# Patient Record
Sex: Male | Born: 1970 | Race: White | Hispanic: No
Health system: Southern US, Community
[De-identification: ages and names within clinical notes are randomized; demographics above are authoritative.]

## PROBLEM LIST (undated history)

## (undated) DIAGNOSIS — J9621 Acute and chronic respiratory failure with hypoxia: Secondary | ICD-10-CM

## (undated) DIAGNOSIS — A419 Sepsis, unspecified organism: Secondary | ICD-10-CM

## (undated) DIAGNOSIS — J181 Lobar pneumonia, unspecified organism: Secondary | ICD-10-CM

## (undated) DIAGNOSIS — R6520 Severe sepsis without septic shock: Secondary | ICD-10-CM

## (undated) DIAGNOSIS — G40909 Epilepsy, unspecified, not intractable, without status epilepticus: Secondary | ICD-10-CM

## (undated) DIAGNOSIS — B20 Human immunodeficiency virus [HIV] disease: Secondary | ICD-10-CM

---

## 2019-06-21 ENCOUNTER — Other Ambulatory Visit (HOSPITAL_COMMUNITY): Payer: Medicare Other

## 2019-06-21 ENCOUNTER — Inpatient Hospital Stay
Admission: AD | Admit: 2019-06-21 | Discharge: 2019-08-06 | Disposition: E | Payer: Medicare Other | Source: Other Acute Inpatient Hospital | Attending: Internal Medicine | Admitting: Internal Medicine

## 2019-06-21 DIAGNOSIS — R6520 Severe sepsis without septic shock: Secondary | ICD-10-CM | POA: Diagnosis present

## 2019-06-21 DIAGNOSIS — K567 Ileus, unspecified: Secondary | ICD-10-CM

## 2019-06-21 DIAGNOSIS — Z9289 Personal history of other medical treatment: Secondary | ICD-10-CM

## 2019-06-21 DIAGNOSIS — R1915 Other abnormal bowel sounds: Secondary | ICD-10-CM

## 2019-06-21 DIAGNOSIS — Z0189 Encounter for other specified special examinations: Secondary | ICD-10-CM

## 2019-06-21 DIAGNOSIS — J9621 Acute and chronic respiratory failure with hypoxia: Secondary | ICD-10-CM | POA: Diagnosis present

## 2019-06-21 DIAGNOSIS — J181 Lobar pneumonia, unspecified organism: Secondary | ICD-10-CM | POA: Diagnosis present

## 2019-06-21 DIAGNOSIS — A419 Sepsis, unspecified organism: Secondary | ICD-10-CM | POA: Diagnosis present

## 2019-06-21 DIAGNOSIS — J9690 Respiratory failure, unspecified, unspecified whether with hypoxia or hypercapnia: Secondary | ICD-10-CM

## 2019-06-21 DIAGNOSIS — E8770 Fluid overload, unspecified: Secondary | ICD-10-CM

## 2019-06-21 DIAGNOSIS — Z9889 Other specified postprocedural states: Secondary | ICD-10-CM

## 2019-06-21 DIAGNOSIS — Z931 Gastrostomy status: Secondary | ICD-10-CM

## 2019-06-21 DIAGNOSIS — J90 Pleural effusion, not elsewhere classified: Secondary | ICD-10-CM

## 2019-06-21 DIAGNOSIS — Z4659 Encounter for fitting and adjustment of other gastrointestinal appliance and device: Secondary | ICD-10-CM

## 2019-06-21 DIAGNOSIS — B20 Human immunodeficiency virus [HIV] disease: Secondary | ICD-10-CM | POA: Diagnosis present

## 2019-06-21 DIAGNOSIS — G40909 Epilepsy, unspecified, not intractable, without status epilepticus: Secondary | ICD-10-CM

## 2019-06-21 HISTORY — DX: Acute and chronic respiratory failure with hypoxia: J96.21

## 2019-06-21 HISTORY — DX: Sepsis, unspecified organism: A41.9

## 2019-06-21 HISTORY — DX: Human immunodeficiency virus (HIV) disease: B20

## 2019-06-21 HISTORY — DX: Lobar pneumonia, unspecified organism: J18.1

## 2019-06-21 HISTORY — DX: Epilepsy, unspecified, not intractable, without status epilepticus: G40.909

## 2019-06-21 HISTORY — DX: Sepsis, unspecified organism: R65.20

## 2019-06-21 LAB — BLOOD GAS, ARTERIAL
Acid-Base Excess: 12.9 mmol/L — ABNORMAL HIGH (ref 0.0–2.0)
Bicarbonate: 37.3 mmol/L — ABNORMAL HIGH (ref 20.0–28.0)
FIO2: 35
O2 Saturation: 96.5 %
Patient temperature: 37
pCO2 arterial: 50.3 mmHg — ABNORMAL HIGH (ref 32.0–48.0)
pH, Arterial: 7.483 — ABNORMAL HIGH (ref 7.350–7.450)
pO2, Arterial: 79.7 mmHg — ABNORMAL LOW (ref 83.0–108.0)

## 2019-06-21 MED ORDER — DULOXETINE HCL 30 MG PO CPEP
30.00 | ORAL_CAPSULE | ORAL | Status: DC
Start: 2019-06-22 — End: 2019-06-21

## 2019-06-21 MED ORDER — MULTI-VITAMIN PO TABS
1.00 | ORAL_TABLET | ORAL | Status: DC
Start: 2019-06-22 — End: 2019-06-21

## 2019-06-21 MED ORDER — L-LYSINE EX OINT
TOPICAL_OINTMENT | CUTANEOUS | Status: DC
Start: ? — End: 2019-06-21

## 2019-06-21 MED ORDER — GENERIC EXTERNAL MEDICATION
Status: DC
Start: ? — End: 2019-06-21

## 2019-06-21 MED ORDER — METOPROLOL TARTRATE 25 MG PO TABS
25.00 | ORAL_TABLET | ORAL | Status: DC
Start: 2019-06-21 — End: 2019-06-21

## 2019-06-21 MED ORDER — ELVITEG-COBIC-EMTRICIT-TENOFAF 150-150-200-10 MG PO TABS
1.00 | ORAL_TABLET | ORAL | Status: DC
Start: 2019-06-22 — End: 2019-06-21

## 2019-06-21 MED ORDER — ONDANSETRON HCL 4 MG/2ML IJ SOLN
4.00 | INTRAMUSCULAR | Status: DC
Start: ? — End: 2019-06-21

## 2019-06-21 MED ORDER — INSULIN NPH (HUMAN) (ISOPHANE) 100 UNIT/ML ~~LOC~~ SUSP
25.00 | SUBCUTANEOUS | Status: DC
Start: 2019-06-21 — End: 2019-06-21

## 2019-06-21 MED ORDER — FOLIC ACID 1 MG PO TABS
1.00 | ORAL_TABLET | ORAL | Status: DC
Start: 2019-06-22 — End: 2019-06-21

## 2019-06-21 MED ORDER — QUETIAPINE FUMARATE 25 MG PO TABS
25.00 | ORAL_TABLET | ORAL | Status: DC
Start: 2019-06-21 — End: 2019-06-21

## 2019-06-21 MED ORDER — PREDNISONE 10 MG PO TABS
10.00 | ORAL_TABLET | ORAL | Status: DC
Start: 2019-06-21 — End: 2019-06-21

## 2019-06-21 MED ORDER — PREGABALIN 100 MG PO CAPS
100.00 | ORAL_CAPSULE | ORAL | Status: DC
Start: 2019-06-21 — End: 2019-06-21

## 2019-06-21 MED ORDER — DEXTROSE 50 % IV SOLN
25.00 | INTRAVENOUS | Status: DC
Start: ? — End: 2019-06-21

## 2019-06-21 MED ORDER — PNEUMOCOCCAL VAC POLYVALENT 25 MCG/0.5ML IJ INJ
0.50 | INJECTION | INTRAMUSCULAR | Status: DC
Start: ? — End: 2019-06-21

## 2019-06-21 MED ORDER — LEVETIRACETAM IN NACL 500 MG/100ML IV SOLN
500.00 | INTRAVENOUS | Status: DC
Start: 2019-06-22 — End: 2019-06-21

## 2019-06-21 MED ORDER — SODIUM CHLORIDE 0.9 % IV SOLN
INTRAVENOUS | Status: DC
Start: ? — End: 2019-06-21

## 2019-06-21 MED ORDER — FENTANYL CITRATE (PF) 50 MCG/ML IJ SOLN
100.00 | INTRAMUSCULAR | Status: DC
Start: ? — End: 2019-06-21

## 2019-06-21 MED ORDER — MIRTAZAPINE 15 MG PO TABS
15.00 | ORAL_TABLET | ORAL | Status: DC
Start: 2019-06-21 — End: 2019-06-21

## 2019-06-21 MED ORDER — CARBOXYMETHYLCELLULOSE SODIUM 0.5 % OP SOLN
2.00 | OPHTHALMIC | Status: DC
Start: 2019-06-21 — End: 2019-06-21

## 2019-06-21 MED ORDER — NALOXONE HCL 0.4 MG/ML IJ SOLN
0.04 | INTRAMUSCULAR | Status: DC
Start: ? — End: 2019-06-21

## 2019-06-21 MED ORDER — GENERIC EXTERNAL MEDICATION
5.00 | Status: DC
Start: ? — End: 2019-06-21

## 2019-06-21 MED ORDER — LACTULOSE 10 GM/15ML PO SOLN
45.00 | ORAL | Status: DC
Start: 2019-06-21 — End: 2019-06-21

## 2019-06-21 MED ORDER — DEXTROSE 5 % IV SOLN
INTRAVENOUS | Status: DC
Start: ? — End: 2019-06-21

## 2019-06-21 MED ORDER — PANTOPRAZOLE SODIUM 40 MG IV SOLR
40.00 | INTRAVENOUS | Status: DC
Start: 2019-06-22 — End: 2019-06-21

## 2019-06-21 MED ORDER — THIAMINE HCL 100 MG PO TABS
100.00 | ORAL_TABLET | ORAL | Status: DC
Start: 2019-06-22 — End: 2019-06-21

## 2019-06-21 MED ORDER — ALBUTEROL SULFATE (5 MG/ML) 0.5% IN NEBU
2.50 | INHALATION_SOLUTION | RESPIRATORY_TRACT | Status: DC
Start: ? — End: 2019-06-21

## 2019-06-21 MED ORDER — EPINEPHRINE 1 MG/10ML IJ SOSY
1.00 | PREFILLED_SYRINGE | INTRAMUSCULAR | Status: DC
Start: ? — End: 2019-06-21

## 2019-06-21 MED ORDER — MAGNESIUM SULFATE 4 GM/100ML IV SOLN
4.00 | INTRAVENOUS | Status: DC
Start: ? — End: 2019-06-21

## 2019-06-21 MED ORDER — GENERIC EXTERNAL MEDICATION
1.00 | Status: DC
Start: ? — End: 2019-06-21

## 2019-06-21 MED ORDER — GLUCOSE-VITAMIN C 4-6 GM-MG PO CHEW
12.00 | CHEWABLE_TABLET | ORAL | Status: DC
Start: ? — End: 2019-06-21

## 2019-06-21 MED ORDER — BACLOFEN 10 MG PO TABS
10.00 | ORAL_TABLET | ORAL | Status: DC
Start: ? — End: 2019-06-21

## 2019-06-21 MED ORDER — RIFAXIMIN 550 MG PO TABS
550.00 | ORAL_TABLET | ORAL | Status: DC
Start: 2019-06-21 — End: 2019-06-21

## 2019-06-21 MED ORDER — CLONIDINE HCL 0.1 MG PO TABS
0.10 | ORAL_TABLET | ORAL | Status: DC
Start: 2019-06-21 — End: 2019-06-21

## 2019-06-21 MED ORDER — FLUPHENAZINE HCL 1 MG PO TABS
1.00 | ORAL_TABLET | ORAL | Status: DC
Start: 2019-06-21 — End: 2019-06-21

## 2019-06-21 MED ORDER — ACETAMINOPHEN 325 MG PO TABS
650.00 | ORAL_TABLET | ORAL | Status: DC
Start: ? — End: 2019-06-21

## 2019-06-21 MED ORDER — LACTATED RINGERS IV SOLN
500.00 | INTRAVENOUS | Status: DC
Start: ? — End: 2019-06-21

## 2019-06-21 MED ORDER — IPRATROPIUM-ALBUTEROL 0.5-2.5 (3) MG/3ML IN SOLN
3.00 | RESPIRATORY_TRACT | Status: DC
Start: ? — End: 2019-06-21

## 2019-06-21 MED ORDER — INSULIN ASPART 100 UNIT/ML FLEXPEN
0.00 | PEN_INJECTOR | SUBCUTANEOUS | Status: DC
Start: 2019-06-21 — End: 2019-06-21

## 2019-06-21 MED ORDER — FLUMAZENIL 0.5 MG/5ML IV SOLN
0.20 | INTRAVENOUS | Status: DC
Start: ? — End: 2019-06-21

## 2019-06-21 MED ORDER — RA PROBIOTIC DIGESTIVE CARE PO CAPS
1.00 | ORAL_CAPSULE | ORAL | Status: DC
Start: 2019-06-21 — End: 2019-06-21

## 2019-06-21 MED ORDER — GENERIC EXTERNAL MEDICATION
4000.00 | Status: DC
Start: ? — End: 2019-06-21

## 2019-06-21 MED ORDER — DEXMEDETOMIDINE HCL IN NACL 400 MCG/100ML IV SOLN
0.15 | INTRAVENOUS | Status: DC
Start: ? — End: 2019-06-21

## 2019-06-21 MED ORDER — ATROPINE SULFATE 0.5 MG/5ML IJ SOSY
0.50 | PREFILLED_SYRINGE | INTRAMUSCULAR | Status: DC
Start: ? — End: 2019-06-21

## 2019-06-22 ENCOUNTER — Encounter: Payer: Self-pay | Admitting: Internal Medicine

## 2019-06-22 DIAGNOSIS — G40909 Epilepsy, unspecified, not intractable, without status epilepticus: Secondary | ICD-10-CM

## 2019-06-22 DIAGNOSIS — B20 Human immunodeficiency virus [HIV] disease: Secondary | ICD-10-CM

## 2019-06-22 DIAGNOSIS — R6520 Severe sepsis without septic shock: Secondary | ICD-10-CM

## 2019-06-22 DIAGNOSIS — J9621 Acute and chronic respiratory failure with hypoxia: Secondary | ICD-10-CM | POA: Diagnosis present

## 2019-06-22 DIAGNOSIS — J181 Lobar pneumonia, unspecified organism: Secondary | ICD-10-CM | POA: Diagnosis not present

## 2019-06-22 DIAGNOSIS — A419 Sepsis, unspecified organism: Secondary | ICD-10-CM

## 2019-06-22 LAB — COMPREHENSIVE METABOLIC PANEL
ALT: 52 U/L — ABNORMAL HIGH (ref 0–44)
AST: 79 U/L — ABNORMAL HIGH (ref 15–41)
Albumin: 1.9 g/dL — ABNORMAL LOW (ref 3.5–5.0)
Alkaline Phosphatase: 151 U/L — ABNORMAL HIGH (ref 38–126)
Anion gap: 8 (ref 5–15)
BUN: 17 mg/dL (ref 6–20)
CO2: 34 mmol/L — ABNORMAL HIGH (ref 22–32)
Calcium: 8.5 mg/dL — ABNORMAL LOW (ref 8.9–10.3)
Chloride: 104 mmol/L (ref 98–111)
Creatinine, Ser: 0.51 mg/dL — ABNORMAL LOW (ref 0.61–1.24)
GFR calc Af Amer: >60 mL/min (ref >60)
GFR calc non Af Amer: >60 mL/min (ref >60)
Glucose, Bld: 103 mg/dL — ABNORMAL HIGH (ref 70–99)
Potassium: 3.7 mmol/L (ref 3.5–5.1)
Sodium: 146 mmol/L — ABNORMAL HIGH (ref 135–145)
Total Bilirubin: 8 mg/dL — ABNORMAL HIGH (ref 0.3–1.2)
Total Protein: 5.4 g/dL — ABNORMAL LOW (ref 6.5–8.1)

## 2019-06-22 LAB — CBC
HCT: 39.4 % (ref 39.0–52.0)
Hemoglobin: 12.6 g/dL — ABNORMAL LOW (ref 13.0–17.0)
MCH: 39.7 pg — ABNORMAL HIGH (ref 26.0–34.0)
MCHC: 32 g/dL (ref 30.0–36.0)
MCV: 124.3 fL — ABNORMAL HIGH (ref 80.0–100.0)
Platelets: 46 10*3/uL — ABNORMAL LOW (ref 150–400)
RBC: 3.17 MIL/uL — ABNORMAL LOW (ref 4.22–5.81)
RDW: 15.3 % (ref 11.5–15.5)
WBC: 9.5 10*3/uL (ref 4.0–10.5)
nRBC: 0 % (ref 0.0–0.2)

## 2019-06-22 NOTE — Consult Note (Signed)
Pulmonary Critical Care Medicine [place]  PULMONARY SERVICE  Date of Service: 06/22/2019  PULMONARY CRITICAL CARE CONSULT   Anthony Rodriguez  NID:[id number]  DOB: May 31, 1971   DOA: 06/30/2019  Referring Physician: Merton Border, MD  HPI: Anthony Rodriguez is a 48 y.o. male seen for follow up of Acute on Chronic Respiratory Failure.  Patient has multiple medical problems including history of HIV on Hart therapy hepatitis C cirrhosis of liver seizure disorder bipolar disorder presented to the hospital because of a diagnosis of pneumonia.  Patient at that time was seen started on antibiotics had been complaining about generalized weakness aches and pains.  Admitted to the hospital had increased work of breathing noted increased oxygen requirements and patient eventually ended up having to be intubated after about 4 days.  Patient now is orally intubated on the ventilator and is nonverbal and has endotracheal tube in place.  Hospital course was that patient did receive antibiotics and was transferred to our facility for further management and weaning.  Review of Systems:  ROS performed and is unremarkable other than noted above.  Past Medical History:  Diagnosis Date  . Anxiety  . Arthritis  . Back pain  . Bilateral leg pain  . Bipolar 1 disorder ([place])  . Bipolar 1 disorder ([place])  . Bipolar disorder ([place])  . Blood disorder  low platelets  . Calculus of kidney  . Cirrhosis ([place])  HEPATITIS C  . Cirrhosis, non-alcoholic ([place])  . 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 Depression  PTSD  . Diabetes mellitus ([place])  . 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 GERD (gastroesophageal reflux disease)  . Headache  . Herpes  . Human immunodeficiency virus (HIV) disease ([place])  . Left shoulder pain  . Low back pain  . Migraines  . MS (multiple sclerosis) ([place])  . Neck pain  . Portal vein thrombosis  . Psychosis ([place])  . PTSD  (post-traumatic stress disorder)  . Seizure disorder ([place])  . Splenomegaly  . Varices, esophageal (HCC)  GRADE 4 ESOPHAGEAL VARICES  . Vitamin D deficiency    Past Surgical History Past Surgical History:  Procedure Laterality Date  . HX APPENDECTOMY  . HX EAR SURGERY  stapes transplant  . HX KNEE ARTHROSCOPY Left  . HX KNEE REPLACMENT  . HX LITHOTRIPSY  . HX SURGICAL OTHER  bands in throat and stomach, due to cirrhosis for esophageal varicies  . HX TIPS PROCEDURE  . HX TYMPANOSTOMY  . PR COLONOSCOPY FLX DX W/COLLJ SPEC WHEN PFRMD N/A 01/21/2015  COLONOSCOPY FLX DX W/COLLJ SPEC WHEN PFRMD performed by Jana Half, MD at [place]  . PR EGD TRANSORAL BIOPSY SINGLE/MULTIPLE Left 12/03/2014  EGD TRANSORAL BIOPSY SINGLE/MULTIPLE performed by Jana Half, MD at [place]  . PR ESOPHAGOGASTRODUODENOSCOPY TRANSORAL DIAGNOSTIC N/A 12/27/2014  ESOPHAGOGASTRODUODENOSCOPY TRANSORAL DIAGNOSTIC performed by Jana Half, MD at [place]  . PR ESOPHAGOGASTRODUODENOSCOPY TRANSORAL DIAGNOSTIC N/A 04/07/2017  ESOPHAGOGASTRODUODENOSCOPY TRANSORAL DIAGNOSTIC performed by Donald Pore, MD at [place] reports that he has been smoking e-cigarette/mist inhalation device. He has a 32.00 pack-year smoking history. He has never used smokeless tobacco. He reports that he does not drink alcohol or use drugs.  Family History family history includes Diabetes in his father and paternal grandmother; Heart Disease in his father; Hypertension in his father; Liver Disease in his father.   Allergies Allergies  Allergen Reactions  . Lactulose Nausea and Vomiting  .  Paroxetine Other (See Comments)  Flu like symptoms    Medications: Reviewed on Rounds  Physical Exam:  Vitals: Temperature 97.6 pulse 63 respiratory 12 blood pressure 129/70 saturations 97%  Ventilator Settings mode of ventilation is pressure support FiO2 40% tidal line 800 PEEP 5 pressure support 12 orally  intubated  . General: Comfortable at this time . Eyes: Grossly normal lids, irises & conjunctiva . ENT: grossly tongue is normal . Neck: no obvious mass . Cardiovascular: S1-S2 normal no gallop or rub . Respiratory: No rhonchi no rales are noted at this time . Abdomen: Soft and nontender . Skin: no rash seen on limited exam . Musculoskeletal: not rigid . Psychiatric:unable to assess . Neurologic: no seizure no involuntary movements         Labs on Admission:  Basic Metabolic Panel: Recent Labs  Lab 06/22/19 0514  NA 146*  K 3.7  CL 104  CO2 34*  GLUCOSE 103*  BUN 17  CREATININE 0.51*  CALCIUM 8.5*    Recent Labs  Lab 10-28-18 1858  PHART 7.483*  PCO2ART 50.3*  PO2ART 79.7*  HCO3 37.3*  O2SAT 96.5    Liver Function Tests: Recent Labs  Lab 06/22/19 0514  AST 79*  ALT 52*  ALKPHOS 151*  BILITOT 8.0*  PROT 5.4*  ALBUMIN 1.9*   No results for input(s): LIPASE, AMYLASE in the last 168 hours. No results for input(s): AMMONIA in the last 168 hours.  CBC: Recent Labs  Lab 06/22/19 0514  WBC 9.5  HGB 12.6*  HCT 39.4  MCV 124.3*  PLT 46*    Cardiac Enzymes: No results for input(s): CKTOTAL, CKMB, CKMBINDEX, TROPONINI in the last 168 hours.  BNP (last 3 results) No results for input(s): BNP in the last 8760 hours.  ProBNP (last 3 results) No results for input(s): PROBNP in the last 8760 hours.   Radiological Exams on Admission: DG Abd 1 View  Result Date: 10-28-2018 CLINICAL DATA:  OG and ETT placement EXAM: ABDOMEN - 1 VIEW COMPARISON:  Concurrent chest radiograph FINDINGS: Central venous catheter tip terminates in the region of the right atrium. Transesophageal tube tip and side port distal to the GE junction. Vascular coil pack noted in the midline upper abdomen. A biliary stent present in the right upper quadrant. Additional surgical clip seen of the right lower quadrant. Telemetry leads overlies the left hemiabdomen. Nonobstructive bowel gas  pattern. Few punctate radiodensities project over the right renal shadow. Streaky basilar opacities in the lungs likely reflect some atelectasis and right effusion. IMPRESSION: 1. Central venous catheter tip terminates in the right atrium. 2. Transesophageal tube tip and side port distal to the GE junction. 3. Few punctate radiodensities project over the right renal shadow. Could reflect nephrolithiasis. 4. Nonobstructive bowel gas pattern. Electronically Signed   By: Kreg ShropshirePrice  DeHay M.D.   On: 10-28-2018 17:34   DG Chest Port 1 View  Result Date: 10-28-2018 CLINICAL DATA:  Line placement EXAM: PORTABLE CHEST 1 VIEW COMPARISON:  None. FINDINGS: Endotracheal tube is approximately 6 cm from the carina. Enteric tube passes into the stomach with tip out of field of view. Right PICC line tip overlies the cavoatrial junction. No focal consolidation. Question hazy right lung increased density. No pneumothorax. No significant pleural effusion. Normal heart size. IMPRESSION: Lines and tubes as above. Possible hazy increased density on the right, which may reflect atelectasis or asymmetric edema. Electronically Signed   By: Guadlupe SpanishPraneil  Patel M.D.   On: 10-28-2018 17:30    Assessment/Plan  Active Problems:   Acute on chronic respiratory failure with hypoxia (HCC)   Lobar pneumonia, unspecified organism (HCC)   Severe sepsis (HCC)   HIV disease (HCC)   Seizure disorder (HCC)   1. Acute on chronic respiratory failure with hypoxia he remains on the ventilator and pressure support mode which appears to be tolerating well right now remains orally intubated we will see if able to tolerate we will give him an extubation trial if he fails then patient will need to be trached.  We will monitor ABGs monitor chest x-rays as necessary 2. Lobar pneumonia unspecified patient had a chest x-ray done most recent x-ray shows some layering densities in the right side with atelectasis versus edema we will continue with supportive care  and has completed antibiotics. 3. Severe sepsis secondary to lobar pneumonia has been treated right now hemodynamics are stable continue with supportive care 4. HIV disease patient is on [place] therapy plan is to continue treatment. 5. Seizure disorder no active seizures noted at this time we will continue to monitor.  I have personally seen and evaluated the patient, evaluated laboratory and imaging results, formulated the assessment and plan and placed orders.  Patient is critically ill in danger of cardiac arrest and death requires ongoing critical management ventilator and has a high risk airway The Patient requires high complexity decision making for assessment and support.  Case was discussed on Rounds with the Respiratory Therapy Staff Time Spent  Yevonne Pax, MD [facility] Pulmonary Critical Care Medicine Sleep Medicine

## 2019-06-23 DIAGNOSIS — J181 Lobar pneumonia, unspecified organism: Secondary | ICD-10-CM | POA: Diagnosis not present

## 2019-06-23 DIAGNOSIS — J9621 Acute and chronic respiratory failure with hypoxia: Secondary | ICD-10-CM | POA: Diagnosis not present

## 2019-06-23 DIAGNOSIS — B20 Human immunodeficiency virus [HIV] disease: Secondary | ICD-10-CM | POA: Diagnosis not present

## 2019-06-23 DIAGNOSIS — G40909 Epilepsy, unspecified, not intractable, without status epilepticus: Secondary | ICD-10-CM | POA: Diagnosis not present

## 2019-06-23 NOTE — Progress Notes (Signed)
Pulmonary Critical Care Medicine [facility] [place]   PULMONARY CRITICAL CARE SERVICE  PROGRESS NOTE  Date of Service: 06/23/2019  DAKWAN PRIDGEN  BHA:[id number]  DOB: 1971-02-26   DOA: 07/02/2019  Referring Physician: Carron Curie, MD  HPI: Anthony Rodriguez is a 48 y.o. male seen for follow up of Acute on Chronic Respiratory Failure.  Patient currently is on pressure support mode has been on FiO2 40% goal is for 12 hours.  Remains orally intubated.  His volumes are actually looking pretty good so may be encouraging that we may actually be able to give him a trial of extubation  Medications: Reviewed on Rounds  Physical Exam:  Vitals: Temperature 97.8 pulse 88 respiratory 18 blood pressure 139/76 saturations 96%  Ventilator Settings mode of ventilation pressure support FiO2 is 40% tidal volume was 680 pressure 12 PEEP 5  . General: Comfortable at this time . Eyes: Grossly normal lids, irises & conjunctiva . ENT: grossly tongue is normal . Neck: no obvious mass . Cardiovascular: S1 S2 normal no gallop . Respiratory: No rhonchi no rales are noted at this time . Abdomen: soft . Skin: no rash seen on limited exam . Musculoskeletal: not rigid . Psychiatric:unable to assess . Neurologic: no seizure no involuntary movements         Lab Data:   Basic Metabolic Panel: Recent Labs  Lab 06/22/19 0514  NA 146*  K 3.7  CL 104  CO2 34*  GLUCOSE 103*  BUN 17  CREATININE 0.51*  CALCIUM 8.5*    ABG: Recent Labs  Lab 06/12/2019 1858  PHART 7.483*  PCO2ART 50.3*  PO2ART 79.7*  HCO3 37.3*  O2SAT 96.5    Liver Function Tests: Recent Labs  Lab 06/22/19 0514  AST 79*  ALT 52*  ALKPHOS 151*  BILITOT 8.0*  PROT 5.4*  ALBUMIN 1.9*   No results for input(s): LIPASE, AMYLASE in the last 168 hours. No results for input(s): AMMONIA in the last 168 hours.  CBC: Recent Labs  Lab 06/22/19 0514  WBC 9.5  HGB 12.6*  HCT 39.4  MCV 124.3*  PLT 46*    Cardiac  Enzymes: No results for input(s): CKTOTAL, CKMB, CKMBINDEX, TROPONINI in the last 168 hours.  BNP (last 3 results) No results for input(s): BNP in the last 8760 hours.  ProBNP (last 3 results) No results for input(s): PROBNP in the last 8760 hours.  Radiological Exams: DG Abd 1 View  Result Date: 06/28/2019 CLINICAL DATA:  OG and ETT placement EXAM: ABDOMEN - 1 VIEW COMPARISON:  Concurrent chest radiograph FINDINGS: Central venous catheter tip terminates in the region of the right atrium. Transesophageal tube tip and side port distal to the GE junction. Vascular coil pack noted in the midline upper abdomen. A biliary stent present in the right upper quadrant. Additional surgical clip seen of the right lower quadrant. Telemetry leads overlies the left hemiabdomen. Nonobstructive bowel gas pattern. Few punctate radiodensities project over the right renal shadow. Streaky basilar opacities in the lungs likely reflect some atelectasis and right effusion. IMPRESSION: 1. Central venous catheter tip terminates in the right atrium. 2. Transesophageal tube tip and side port distal to the GE junction. 3. Few punctate radiodensities project over the right renal shadow. Could reflect nephrolithiasis. 4. Nonobstructive bowel gas pattern. Electronically Signed   By: Kreg Shropshire M.D.   On: 06/11/2019 17:34   DG Chest Port 1 View  Result Date: 06/11/2019 CLINICAL DATA:  Line placement EXAM: PORTABLE CHEST 1 VIEW  COMPARISON:  None. FINDINGS: Endotracheal tube is approximately 6 cm from the carina. Enteric tube passes into the stomach with tip out of field of view. Right PICC line tip overlies the cavoatrial junction. No focal consolidation. Question hazy right lung increased density. No pneumothorax. No significant pleural effusion. Normal heart size. IMPRESSION: Lines and tubes as above. Possible hazy increased density on the right, which may reflect atelectasis or asymmetric edema. Electronically Signed   By:  Macy Mis M.D.   On: 07-12-2019 17:30    Assessment/Plan Active Problems:   Acute on chronic respiratory failure with hypoxia (HCC)   Lobar pneumonia, unspecified organism (HCC)   Severe sepsis (HCC)   HIV disease (HCC)   Seizure disorder ([place])   1. Acute on chronic respiratory failure hypoxia plan is to continue with weaning protocol patient remains orally intubated with a high risk airway 2. Lobar pneumonia treated we will continue to monitor. 3. Severe sepsis hemodynamics are stable 4. HIV disease on Hart therapy 5. Seizure disorder no active seizure noted at this time.   I have personally seen and evaluated the patient, evaluated laboratory and imaging results, formulated the assessment and plan and placed orders.  Patient is critically ill in danger of cardiac arrest and death as a high risk airway time 35 minutes The Patient requires high complexity decision making for assessment and support.  Case was discussed on Rounds with the Respiratory Therapy Staff  Allyne Gee, MD [facility] Pulmonary Critical Care Medicine Sleep Medicine

## 2019-06-24 ENCOUNTER — Other Ambulatory Visit (HOSPITAL_COMMUNITY): Payer: Medicare Other

## 2019-06-24 DIAGNOSIS — J9621 Acute and chronic respiratory failure with hypoxia: Secondary | ICD-10-CM | POA: Diagnosis not present

## 2019-06-24 DIAGNOSIS — J181 Lobar pneumonia, unspecified organism: Secondary | ICD-10-CM | POA: Diagnosis not present

## 2019-06-24 DIAGNOSIS — B20 Human immunodeficiency virus [HIV] disease: Secondary | ICD-10-CM | POA: Diagnosis not present

## 2019-06-24 DIAGNOSIS — G40909 Epilepsy, unspecified, not intractable, without status epilepticus: Secondary | ICD-10-CM | POA: Diagnosis not present

## 2019-06-24 LAB — BASIC METABOLIC PANEL
Anion gap: 6 (ref 5–15)
BUN: 20 mg/dL (ref 6–20)
CO2: 33 mmol/L — ABNORMAL HIGH (ref 22–32)
Calcium: 8.6 mg/dL — ABNORMAL LOW (ref 8.9–10.3)
Chloride: 110 mmol/L (ref 98–111)
Creatinine, Ser: 0.6 mg/dL — ABNORMAL LOW (ref 0.61–1.24)
GFR calc Af Amer: >60 mL/min (ref >60)
GFR calc non Af Amer: >60 mL/min (ref >60)
Glucose, Bld: 159 mg/dL — ABNORMAL HIGH (ref 70–99)
Potassium: 3.7 mmol/L (ref 3.5–5.1)
Sodium: 149 mmol/L — ABNORMAL HIGH (ref 135–145)

## 2019-06-24 LAB — CBC
HCT: 39.7 % (ref 39.0–52.0)
HCT: 37.6 % — ABNORMAL LOW (ref 39.0–52.0)
Hemoglobin: 12.9 g/dL — ABNORMAL LOW (ref 13.0–17.0)
Hemoglobin: 12.2 g/dL — ABNORMAL LOW (ref 13.0–17.0)
MCH: 40.7 pg — ABNORMAL HIGH (ref 26.0–34.0)
MCH: 40.5 pg — ABNORMAL HIGH (ref 26.0–34.0)
MCHC: 32.5 g/dL (ref 30.0–36.0)
MCHC: 32.4 g/dL (ref 30.0–36.0)
MCV: 125.2 fL — ABNORMAL HIGH (ref 80.0–100.0)
MCV: 124.9 fL — ABNORMAL HIGH (ref 80.0–100.0)
Platelets: 51 10*3/uL — ABNORMAL LOW (ref 150–400)
Platelets: 47 10*3/uL — ABNORMAL LOW (ref 150–400)
RBC: 3.17 MIL/uL — ABNORMAL LOW (ref 4.22–5.81)
RBC: 3.01 MIL/uL — ABNORMAL LOW (ref 4.22–5.81)
RDW: 15.6 % — ABNORMAL HIGH (ref 11.5–15.5)
RDW: 15.8 % — ABNORMAL HIGH (ref 11.5–15.5)
WBC: 8.5 10*3/uL (ref 4.0–10.5)
WBC: 7.3 10*3/uL (ref 4.0–10.5)
nRBC: 0 % (ref 0.0–0.2)
nRBC: 0 % (ref 0.0–0.2)

## 2019-06-24 LAB — LACTATE DEHYDROGENASE: LDH: 316 U/L — ABNORMAL HIGH (ref 98–192)

## 2019-06-24 MED ORDER — GENERIC EXTERNAL MEDICATION
Status: DC
Start: ? — End: 2019-06-24

## 2019-06-24 NOTE — Progress Notes (Signed)
Pulmonary Critical Care Medicine [place]   PULMONARY CRITICAL CARE SERVICE  PROGRESS NOTE  Date of Service: 06/24/2019  Anthony Rodriguez  KKX:[id number]  DOB: 03-21-1971   DOA: 07/01/2019  Referring Physician: Merton Border, MD  HPI: Anthony Rodriguez is a 48 y.o. male seen for follow up of Acute on Chronic Respiratory Failure.  Patient currently is on pressure support remains orally intubated.  Has been having very low respiratory rate likely from sedation  Medications: Reviewed on Rounds  Physical Exam:  Vitals: Temperature 97.1 pulse 77 respiratory 16 blood pressure 131/81 saturations 98%  Ventilator Settings mode of ventilation pressure support FiO2 is 40% pressure poor 12 PEEP five tidal volume is about 800  . General: Comfortable at this time . Eyes: Grossly normal lids, irises & conjunctiva . ENT: grossly tongue is normal . Neck: no obvious mass . Cardiovascular: S1 S2 normal no gallop . Respiratory: No rhonchi no rales are noted at this time . Abdomen: soft . Skin: no rash seen on limited exam . Musculoskeletal: not rigid . Psychiatric:unable to assess . Neurologic: no seizure no involuntary movements         Lab Data:   Basic Metabolic Panel: Recent Labs  Lab 06/22/19 0514 06/24/19 0539  NA 146* 149*  K 3.7 3.7  CL 104 110  CO2 34* 33*  GLUCOSE 103* 159*  BUN 17 20  CREATININE 0.51* 0.60*  CALCIUM 8.5* 8.6*    ABG: Recent Labs  Lab 07/05/2019 1858  PHART 7.483*  PCO2ART 50.3*  PO2ART 79.7*  HCO3 37.3*  O2SAT 96.5    Liver Function Tests: Recent Labs  Lab 06/22/19 0514  AST 79*  ALT 52*  ALKPHOS 151*  BILITOT 8.0*  PROT 5.4*  ALBUMIN 1.9*   No results for input(s): LIPASE, AMYLASE in the last 168 hours. No results for input(s): AMMONIA in the last 168 hours.  CBC: Recent Labs  Lab 06/22/19 0514 06/24/19 0539  WBC 9.5 8.5  HGB 12.6* 12.9*  HCT 39.4 39.7  MCV 124.3* 125.2*  PLT 46* 51*    Cardiac  Enzymes: No results for input(s): CKTOTAL, CKMB, CKMBINDEX, TROPONINI in the last 168 hours.  BNP (last 3 results) No results for input(s): BNP in the last 8760 hours.  ProBNP (last 3 results) No results for input(s): PROBNP in the last 8760 hours.  Radiological Exams: DG CHEST PORT 1 VIEW  Result Date: 06/24/2019 CLINICAL DATA:  Respiratory failure. EXAM: PORTABLE CHEST 1 VIEW COMPARISON:  Chest x-ray dated June 21, 2019. FINDINGS: Unchanged endotracheal and enteric tubes. Interval removal of the right upper extremity PICC line. The heart size and mediastinal contours are within normal limits. Increased hazy density throughout the right lung with widening of the lateral pleural margin, suggestive of layering pleural effusion. Superimposed patchy density at the right lung base. No pneumothorax. No acute osseous abnormality. IMPRESSION: 1. Moderate layering right pleural effusion, new since the prior study. Patchy density at the right lung base could reflect atelectasis or infiltrate. Electronically Signed   By: Titus Dubin M.D.   On: 06/24/2019 08:21    Assessment/Plan Active Problems:   Acute on chronic respiratory failure with hypoxia (HCC)   Lobar pneumonia, unspecified organism (HCC)   Severe sepsis (HCC)   HIV disease (HCC)   Seizure disorder ([place])   1. Acute on chronic respiratory failure hypoxia plan is to continue to wean off protocol back off on sedation we will continue with pulmonary toilet supportive care 2.  Lobar pneumonia treated we will continue present management 3. Severe sepsis resolved 4. HIV disease we will continue on present therapy 5. Seizure disorder no active seizures   I have personally seen and evaluated the patient, evaluated laboratory and imaging results, formulated the assessment and plan and placed orders. The Patient requires high complexity decision making for assessment and support.  Case was discussed on Rounds with the Respiratory Therapy  Staff  Yevonne Pax, MD [facility] Pulmonary Critical Care Medicine Sleep Medicine

## 2019-06-25 DIAGNOSIS — B20 Human immunodeficiency virus [HIV] disease: Secondary | ICD-10-CM | POA: Diagnosis not present

## 2019-06-25 DIAGNOSIS — J181 Lobar pneumonia, unspecified organism: Secondary | ICD-10-CM | POA: Diagnosis not present

## 2019-06-25 DIAGNOSIS — J9621 Acute and chronic respiratory failure with hypoxia: Secondary | ICD-10-CM | POA: Diagnosis not present

## 2019-06-25 DIAGNOSIS — G40909 Epilepsy, unspecified, not intractable, without status epilepticus: Secondary | ICD-10-CM | POA: Diagnosis not present

## 2019-06-25 LAB — SARS CORONAVIRUS 2 (TAT 6-24 HRS): SARS Coronavirus 2: NEGATIVE

## 2019-06-25 NOTE — Progress Notes (Signed)
Pulmonary Critical Care Medicine [facility] [place]   PULMONARY CRITICAL CARE SERVICE  PROGRESS NOTE  Date of Service: 06/25/2019  Anthony Rodriguez  PYK:[id number]  DOB: Feb 19, 1971   DOA: Jun 29, 2019  Referring Physician: Carron Curie, MD  HPI: Anthony Rodriguez is a 48 y.o. male seen for follow up of Acute on Chronic Respiratory Failure.  Patient is on full support right now endotracheally intubated.  Still is having some issues with low respiratory rate at times.  Medications: Reviewed on Rounds  Physical Exam:  Vitals: Temperature 98.9 pulse 83 respiratory rate is 23 blood pressure 122/78 saturations 99%  Ventilator Settings mode ventilation assist control FiO2 40% tidal volume 613 PEEP 5  . General: Comfortable at this time . Eyes: Grossly normal lids, irises & conjunctiva . ENT: grossly tongue is normal . Neck: no obvious mass . Cardiovascular: S1 S2 normal no gallop . Respiratory: No rhonchi no rales are noted at this time . Abdomen: soft . Skin: no rash seen on limited exam . Musculoskeletal: not rigid . Psychiatric:unable to assess . Neurologic: no seizure no involuntary movements         Lab Data:   Basic Metabolic Panel: Recent Labs  Lab 06/22/19 0514 06/24/19 0539  NA 146* 149*  K 3.7 3.7  CL 104 110  CO2 34* 33*  GLUCOSE 103* 159*  BUN 17 20  CREATININE 0.51* 0.60*  CALCIUM 8.5* 8.6*    ABG: Recent Labs  Lab June 29, 2019 1858  PHART 7.483*  PCO2ART 50.3*  PO2ART 79.7*  HCO3 37.3*  O2SAT 96.5    Liver Function Tests: Recent Labs  Lab 06/22/19 0514  AST 79*  ALT 52*  ALKPHOS 151*  BILITOT 8.0*  PROT 5.4*  ALBUMIN 1.9*   No results for input(s): LIPASE, AMYLASE in the last 168 hours. No results for input(s): AMMONIA in the last 168 hours.  CBC: Recent Labs  Lab 06/22/19 0514 06/24/19 0539 06/24/19 1415  WBC 9.5 8.5 7.3  HGB 12.6* 12.9* 12.2*  HCT 39.4 39.7 37.6*  MCV 124.3* 125.2* 124.9*  PLT 46* 51* 47*    Cardiac  Enzymes: No results for input(s): CKTOTAL, CKMB, CKMBINDEX, TROPONINI in the last 168 hours.  BNP (last 3 results) No results for input(s): BNP in the last 8760 hours.  ProBNP (last 3 results) No results for input(s): PROBNP in the last 8760 hours.  Radiological Exams: DG Abd 1 View  Result Date: 06/24/2019 CLINICAL DATA:  Orogastric tube placement. EXAM: ABDOMEN - 1 VIEW COMPARISON:  One view abdomen June 29, 2019. FINDINGS: 1430 hours. Tip of the enteric tube projects over the L2 vertebral body, likely in the distal stomach. The visualized bowel gas pattern is normal. Embolization coils overlie the epigastric region. There is a metallic stent in the right upper quadrant most consistent with previous TIPS procedure. There is a right abdominal calcification which may reflect a renal calculus or gallstone. Additional surgical clips overlie the right iliac bone. IMPRESSION: 1. Enteric tube tip projects over the distal stomach. 2. Right abdominal calcification may reflect a renal calculus or gallstone. 3. Postsurgical changes as described. Electronically Signed   By: Carey Bullocks M.D.   On: 06/24/2019 15:25   DG CHEST PORT 1 VIEW  Result Date: 06/24/2019 CLINICAL DATA:  Respiratory failure. EXAM: PORTABLE CHEST 1 VIEW COMPARISON:  Chest x-ray dated 2019-06-29. FINDINGS: Unchanged endotracheal and enteric tubes. Interval removal of the right upper extremity PICC line. The heart size and mediastinal contours are within normal  limits. Increased hazy density throughout the right lung with widening of the lateral pleural margin, suggestive of layering pleural effusion. Superimposed patchy density at the right lung base. No pneumothorax. No acute osseous abnormality. IMPRESSION: 1. Moderate layering right pleural effusion, new since the prior study. Patchy density at the right lung base could reflect atelectasis or infiltrate. Electronically Signed   By: Titus Dubin M.D.   On: 06/24/2019 08:21     Assessment/Plan Active Problems:   Acute on chronic respiratory failure with hypoxia (HCC)   Lobar pneumonia, unspecified organism (HCC)   Severe sepsis (HCC)   HIV disease (HCC)   Seizure disorder ([place])   1. Acute on chronic respiratory failure with hypoxia patient continues on full support and assist control mode respiratory therapy will assess the RSB I 2. Lobar pneumonia treated as moderate layering pleural effusion noted may consider thoracentesis evaluation 3. Severe sepsis treated continue with present management. 4. HIV disease on antiretrovirals 5. Seizure disorder no active seizure noted at this time   I have personally seen and evaluated the patient, evaluated laboratory and imaging results, formulated the assessment and plan and placed orders. The Patient requires high complexity decision making for assessment and support.  Case was discussed on Rounds with the Respiratory Therapy Staff  Allyne Gee, MD [facility] Pulmonary Critical Care Medicine Sleep Medicine

## 2019-06-26 ENCOUNTER — Other Ambulatory Visit (HOSPITAL_COMMUNITY): Payer: Medicare Other

## 2019-06-26 ENCOUNTER — Institutional Professional Consult (permissible substitution) (HOSPITAL_COMMUNITY): Payer: Medicare Other

## 2019-06-26 DIAGNOSIS — G40909 Epilepsy, unspecified, not intractable, without status epilepticus: Secondary | ICD-10-CM | POA: Diagnosis not present

## 2019-06-26 DIAGNOSIS — B20 Human immunodeficiency virus [HIV] disease: Secondary | ICD-10-CM | POA: Diagnosis not present

## 2019-06-26 DIAGNOSIS — J181 Lobar pneumonia, unspecified organism: Secondary | ICD-10-CM | POA: Diagnosis not present

## 2019-06-26 DIAGNOSIS — J9621 Acute and chronic respiratory failure with hypoxia: Secondary | ICD-10-CM | POA: Diagnosis not present

## 2019-06-26 HISTORY — PX: IR THORACENTESIS ASP PLEURAL SPACE W/IMG GUIDE: IMG5380

## 2019-06-26 LAB — LACTATE DEHYDROGENASE, PLEURAL OR PERITONEAL FLUID: LD, Fluid: 49 U/L — ABNORMAL HIGH (ref 3–23)

## 2019-06-26 LAB — BLOOD GAS, ARTERIAL
Acid-Base Excess: 7.4 mmol/L — ABNORMAL HIGH (ref 0.0–2.0)
Bicarbonate: 31.6 mmol/L — ABNORMAL HIGH (ref 20.0–28.0)
FIO2: 30
O2 Saturation: 95.7 %
Patient temperature: 37
pCO2 arterial: 47 mmHg (ref 32.0–48.0)
pH, Arterial: 7.443 (ref 7.350–7.450)
pO2, Arterial: 79.6 mmHg — ABNORMAL LOW (ref 83.0–108.0)

## 2019-06-26 LAB — CBC
HCT: 38 % — ABNORMAL LOW (ref 39.0–52.0)
Hemoglobin: 11.9 g/dL — ABNORMAL LOW (ref 13.0–17.0)
MCH: 39.9 pg — ABNORMAL HIGH (ref 26.0–34.0)
MCHC: 31.3 g/dL (ref 30.0–36.0)
MCV: 127.5 fL — ABNORMAL HIGH (ref 80.0–100.0)
Platelets: 43 10*3/uL — ABNORMAL LOW (ref 150–400)
RBC: 2.98 MIL/uL — ABNORMAL LOW (ref 4.22–5.81)
RDW: 15.9 % — ABNORMAL HIGH (ref 11.5–15.5)
WBC: 7.4 10*3/uL (ref 4.0–10.5)
nRBC: 0 % (ref 0.0–0.2)

## 2019-06-26 LAB — GRAM STAIN

## 2019-06-26 LAB — VANCOMYCIN, TROUGH: Vancomycin Tr: 8 ug/mL — ABNORMAL LOW (ref 15–20)

## 2019-06-26 MED ORDER — LIDOCAINE HCL 1 % IJ SOLN
INTRAMUSCULAR | Status: DC | PRN
  Administered 2019-06-26: 10 mL

## 2019-06-26 NOTE — Progress Notes (Addendum)
Pulmonary Critical Care Medicine [facility] [place]   PULMONARY CRITICAL CARE SERVICE  PROGRESS NOTE  Date of Service: 06/26/2019  Anthony Rodriguez  IRW:[id number]  DOB: 1971-05-12   DOA: 06/08/2019  Referring Physician: Carron Curie, MD  HPI: Anthony Rodriguez is a 48 y.o. male seen for follow up of Acute on Chronic Respiratory Failure.  Patient currently is on pressure support mode 12/5 had a thoracentesis done today also with removal of 650 cc of fluid.  Now is weaning better the goal today is for about 20 hours.  Medications: Reviewed on Rounds  Physical Exam:  Vitals: Temperature 97.1 pulse 78 respiratory rate 20 blood pressure 136/70 saturations 97%  Ventilator Settings mode of ventilation pressure support FiO2 28% pressure 12 PEEP 5 tidal volume was about 400  . General: Comfortable at this time . Eyes: Grossly normal lids, irises & conjunctiva . ENT: grossly tongue is normal . Neck: no obvious mass . Cardiovascular: S1 S2 normal no gallop . Respiratory: No rhonchi no rales are noted at this time . Abdomen: soft . Skin: no rash seen on limited exam . Musculoskeletal: not rigid . Psychiatric:unable to assess . Neurologic: no seizure no involuntary movements         Lab Data:   Basic Metabolic Panel: Recent Labs  Lab 06/22/19 0514 06/24/19 0539  NA 146* 149*  K 3.7 3.7  CL 104 110  CO2 34* 33*  GLUCOSE 103* 159*  BUN 17 20  CREATININE 0.51* 0.60*  CALCIUM 8.5* 8.6*    ABG: Recent Labs  Lab 07/01/2019 1858  PHART 7.483*  PCO2ART 50.3*  PO2ART 79.7*  HCO3 37.3*  O2SAT 96.5    Liver Function Tests: Recent Labs  Lab 06/22/19 0514  AST 79*  ALT 52*  ALKPHOS 151*  BILITOT 8.0*  PROT 5.4*  ALBUMIN 1.9*   No results for input(s): LIPASE, AMYLASE in the last 168 hours. No results for input(s): AMMONIA in the last 168 hours.  CBC: Recent Labs  Lab 06/22/19 0514 06/24/19 0539 06/24/19 1415 06/26/19 1113  WBC 9.5 8.5 7.3 7.4  HGB  12.6* 12.9* 12.2* 11.9*  HCT 39.4 39.7 37.6* 38.0*  MCV 124.3* 125.2* 124.9* 127.5*  PLT 46* 51* 47* 43*    Cardiac Enzymes: No results for input(s): CKTOTAL, CKMB, CKMBINDEX, TROPONINI in the last 168 hours.  BNP (last 3 results) No results for input(s): BNP in the last 8760 hours.  ProBNP (last 3 results) No results for input(s): PROBNP in the last 8760 hours.  Radiological Exams: DG Abd 1 View  Result Date: 06/24/2019 CLINICAL DATA:  Orogastric tube placement. EXAM: ABDOMEN - 1 VIEW COMPARISON:  One view abdomen 07/03/2019. FINDINGS: 1430 hours. Tip of the enteric tube projects over the L2 vertebral body, likely in the distal stomach. The visualized bowel gas pattern is normal. Embolization coils overlie the epigastric region. There is a metallic stent in the right upper quadrant most consistent with previous TIPS procedure. There is a right abdominal calcification which may reflect a renal calculus or gallstone. Additional surgical clips overlie the right iliac bone. IMPRESSION: 1. Enteric tube tip projects over the distal stomach. 2. Right abdominal calcification may reflect a renal calculus or gallstone. 3. Postsurgical changes as described. Electronically Signed   By: Carey Bullocks M.D.   On: 06/24/2019 15:25   DG Chest Port 1 View  Result Date: 06/26/2019 CLINICAL DATA:  Right-sided thoracentesis. EXAM: PORTABLE CHEST 1 VIEW COMPARISON:  06/24/2019 FINDINGS: The endotracheal tube is  4.5 cm above the carina. The NG tube is coursing down the esophagus and into the stomach. Interval evacuation of the right pleural fluid collection. No postprocedural pneumothorax is identified. Persistent asymmetric pulmonary edema. The left lung appears better aerated. IMPRESSION: Evacuation of right pleural fluid collection without postprocedural pneumothorax. Slight improved lung aeration with decreased pulmonary edema. Electronically Signed   By: Marijo Sanes M.D.   On: 06/26/2019 11:26   IR  THORACENTESIS ASP PLEURAL SPACE W/IMG GUIDE  Result Date: 06/26/2019 INDICATION: Patient with history of pneumonia, right pleural effusion. Request is made for diagnostic and therapeutic right thoracentesis. EXAM: ULTRASOUND GUIDED DIAGNOSTIC AND THERAPEUTIC RIGHT THORACENTESIS MEDICATIONS: 10 ML 1% LIDOCAINE COMPLICATIONS: None immediate. PROCEDURE: An ultrasound guided thoracentesis was thoroughly discussed with the patient and questions answered. The benefits, risks, alternatives and complications were also discussed. The patient understands and wishes to proceed with the procedure. Written consent was obtained. Ultrasound was performed to localize and mark an adequate pocket of fluid in the right chest. The area was then prepped and draped in the normal sterile fashion. 1% Lidocaine was used for local anesthesia. Under ultrasound guidance a 6 Fr Safe-T-Centesis catheter was introduced. Thoracentesis was performed. The catheter was removed and a dressing applied. FINDINGS: A total of approximately 650 mL of yellow fluid was removed. Samples were sent to the laboratory as requested by the clinical team. IMPRESSION: Successful ultrasound guided diagnostic and therapeutic right thoracentesis yielding 650 mL of pleural fluid. Read by: Brynda Greathouse PA-C Electronically Signed   By: Aletta Edouard M.D.   On: 06/26/2019 11:46    Assessment/Plan Active Problems:   Acute on chronic respiratory failure with hypoxia (HCC)   Lobar pneumonia, unspecified organism (HCC)   Severe sepsis (HCC)   HIV disease (HCC)   Seizure disorder ([place])   1. Acute on chronic respiratory failure hypoxia plan is to continue with weaning on pressure support 20-hour goal. 2. Lobar pneumonia treated clinically is improving 3. Severe sepsis resolved 4. HIV disease on antiretroviral therapy 5. Seizure disorder no active seizures noted at this time.   I have personally seen and evaluated the patient, evaluated laboratory and  imaging results, formulated the assessment and plan and placed orders.  Time 35 minutes discussed on rounds after the procedure was done. The Patient requires high complexity decision making for assessment and support.  Case was discussed on Rounds with the Respiratory Therapy Staff  Allyne Gee, MD [facility] Pulmonary Critical Care Medicine Sleep Medicine

## 2019-06-26 NOTE — Procedures (Signed)
PROCEDURE SUMMARY:  Successful US guided right diagnostic and therapeutic thoracentesis. Yielded 650 mL yellow fluid. Pt tolerated procedure well. No immediate complications.  Specimen was sent for labs. CXR ordered.  EBL < 5 mL  Docia Barrier PA-C 06/26/2019 11:03 AM

## 2019-06-27 DIAGNOSIS — B20 Human immunodeficiency virus [HIV] disease: Secondary | ICD-10-CM | POA: Diagnosis not present

## 2019-06-27 DIAGNOSIS — J181 Lobar pneumonia, unspecified organism: Secondary | ICD-10-CM | POA: Diagnosis not present

## 2019-06-27 DIAGNOSIS — J9621 Acute and chronic respiratory failure with hypoxia: Secondary | ICD-10-CM | POA: Diagnosis not present

## 2019-06-27 DIAGNOSIS — G40909 Epilepsy, unspecified, not intractable, without status epilepticus: Secondary | ICD-10-CM | POA: Diagnosis not present

## 2019-06-27 LAB — BASIC METABOLIC PANEL
Anion gap: 9 (ref 5–15)
BUN: 19 mg/dL (ref 6–20)
CO2: 23 mmol/L (ref 22–32)
Calcium: 8.5 mg/dL — ABNORMAL LOW (ref 8.9–10.3)
Chloride: 120 mmol/L — ABNORMAL HIGH (ref 98–111)
Creatinine, Ser: 0.57 mg/dL — ABNORMAL LOW (ref 0.61–1.24)
GFR calc Af Amer: >60 mL/min (ref >60)
GFR calc non Af Amer: >60 mL/min (ref >60)
Glucose, Bld: 131 mg/dL — ABNORMAL HIGH (ref 70–99)
Potassium: 4.4 mmol/L (ref 3.5–5.1)
Sodium: 152 mmol/L — ABNORMAL HIGH (ref 135–145)

## 2019-06-27 LAB — CBC
HCT: 40.9 % (ref 39.0–52.0)
Hemoglobin: 13.1 g/dL (ref 13.0–17.0)
MCH: 40.9 pg — ABNORMAL HIGH (ref 26.0–34.0)
MCHC: 32 g/dL (ref 30.0–36.0)
MCV: 127.8 fL — ABNORMAL HIGH (ref 80.0–100.0)
Platelets: 39 10*3/uL — ABNORMAL LOW (ref 150–400)
RBC: 3.2 MIL/uL — ABNORMAL LOW (ref 4.22–5.81)
RDW: 15.6 % — ABNORMAL HIGH (ref 11.5–15.5)
WBC: 7.3 10*3/uL (ref 4.0–10.5)
nRBC: 0 % (ref 0.0–0.2)

## 2019-06-27 LAB — VANCOMYCIN, TROUGH: Vancomycin Tr: 19 ug/mL (ref 15–20)

## 2019-06-27 NOTE — Progress Notes (Signed)
Pulmonary Critical Care Medicine [place]   PULMONARY CRITICAL CARE SERVICE  PROGRESS NOTE  Date of Service: 06/27/2019  Anthony Rodriguez  GUR:[id number]  DOB: [birth year]   DOA: 07/01/2019  Referring Physician: Merton Border, MD  HPI: Anthony Rodriguez is a 48 y.o. male seen for follow up of Acute on Chronic Respiratory Failure.  Patient apparently extubated yesterday overnight was placed on BiPAP initially did well.  This morning patient was on BiPAP 12 and was requiring 30% FiO2 with good saturations.  Medications: Reviewed on Rounds  Physical Exam:  Vitals: Temperature 97.2 pulse 106 respiratory rate 14 blood pressure 145/70 saturations 96%  Ventilator Settings on BiPAP 12/8 with an FiO2 30%  . General: Comfortable at this time . Eyes: Grossly normal lids, irises & conjunctiva . ENT: grossly tongue is normal . Neck: no obvious mass . Cardiovascular: S1 S2 normal no gallop . Respiratory: No rhonchi no rales are noted at this time . Abdomen: soft . Skin: no rash seen on limited exam . Musculoskeletal: not rigid . Psychiatric:unable to assess . Neurologic: no seizure no involuntary movements         Lab Data:   Basic Metabolic Panel: Recent Labs  Lab 06/22/19 0514 06/24/19 0539 06/27/19 0536  NA 146* 149* 152*  K 3.7 3.7 4.4  CL 104 110 120*  CO2 34* 33* 23  GLUCOSE 103* 159* 131*  BUN 17 20 19   CREATININE 0.51* 0.60* 0.57*  CALCIUM 8.5* 8.6* 8.5*    ABG: Recent Labs  Lab 07/02/2019 1858 06/26/19 1643  PHART 7.483* 7.443  PCO2ART 50.3* 47.0  PO2ART 79.7* 79.6*  HCO3 37.3* 31.6*  O2SAT 96.5 95.7    Liver Function Tests: Recent Labs  Lab 06/22/19 0514  AST 79*  ALT 52*  ALKPHOS 151*  BILITOT 8.0*  PROT 5.4*  ALBUMIN 1.9*   No results for input(s): LIPASE, AMYLASE in the last 168 hours. No results for input(s): AMMONIA in the last 168 hours.  CBC: Recent Labs  Lab 06/22/19 0514 06/24/19 0539 06/24/19 1415 06/26/19 1113  06/27/19 0536  WBC 9.5 8.5 7.3 7.4 7.3  HGB 12.6* 12.9* 12.2* 11.9* 13.1  HCT 39.4 39.7 37.6* 38.0* 40.9  MCV 124.3* 125.2* 124.9* 127.5* 127.8*  PLT 46* 51* 47* 43* 39*    Cardiac Enzymes: No results for input(s): CKTOTAL, CKMB, CKMBINDEX, TROPONINI in the last 168 hours.  BNP (last 3 results) No results for input(s): BNP in the last 8760 hours.  ProBNP (last 3 results) No results for input(s): PROBNP in the last 8760 hours.  Radiological Exams: DG Abd 1 View  Result Date: 06/26/2019 CLINICAL DATA:  Is gastric tube placement EXAM: ABDOMEN - 1 VIEW COMPARISON:  06/26/2019 chest radiograph and 06/24/2019 abdominal radiograph FINDINGS: Layering right pleural effusion. Nasogastric tube extends into the stomach. Although the tip of the tube is not included, based on the positioning of the side port, the tip of the tube is projected to be in the stomach body. A stent projects in the right upper quadrant. Coils are present just to the left of midline in the upper abdomen likely from prior embolization. Lower thoracic spondylosis. IMPRESSION: 1. The tip of the nasogastric tube is projected to be in the stomach body. 2. Right upper quadrant stent (probably from TIPS), left upper quadrant coils (likely from embolization). 3. Right pleural effusion. Electronically Signed   By: Van Clines M.D.   On: 06/26/2019 19:56   DG Chest [facility]  Result Date: 06/26/2019 CLINICAL DATA:  Right-sided thoracentesis. EXAM: PORTABLE CHEST 1 VIEW COMPARISON:  06/24/2019 FINDINGS: The endotracheal tube is 4.5 cm above the carina. The NG tube is coursing down the esophagus and into the stomach. Interval evacuation of the right pleural fluid collection. No postprocedural pneumothorax is identified. Persistent asymmetric pulmonary edema. The left lung appears better aerated. IMPRESSION: Evacuation of right pleural fluid collection without postprocedural pneumothorax. Slight improved lung aeration with  decreased pulmonary edema. Electronically Signed   By: Rudie Meyer M.D.   On: 06/26/2019 11:26   IR THORACENTESIS ASP PLEURAL SPACE W/IMG GUIDE  Result Date: 06/26/2019 INDICATION: Patient with history of pneumonia, right pleural effusion. Request is made for diagnostic and therapeutic right thoracentesis. EXAM: ULTRASOUND GUIDED DIAGNOSTIC AND THERAPEUTIC RIGHT THORACENTESIS MEDICATIONS: 10 ML 1% LIDOCAINE COMPLICATIONS: None immediate. PROCEDURE: An ultrasound guided thoracentesis was thoroughly discussed with the patient and questions answered. The benefits, risks, alternatives and complications were also discussed. The patient understands and wishes to proceed with the procedure. Written consent was obtained. Ultrasound was performed to localize and mark an adequate pocket of fluid in the right chest. The area was then prepped and draped in the normal sterile fashion. 1% Lidocaine was used for local anesthesia. Under ultrasound guidance a 6 Fr Safe-T-Centesis catheter was introduced. Thoracentesis was performed. The catheter was removed and a dressing applied. FINDINGS: A total of approximately 650 mL of yellow fluid was removed. Samples were sent to the laboratory as requested by the clinical team. IMPRESSION: Successful ultrasound guided diagnostic and therapeutic right thoracentesis yielding 650 mL of pleural fluid. Read by: Loyce Dys PA-C Electronically Signed   By: Irish Lack M.D.   On: 06/26/2019 11:46    Assessment/Plan Active Problems:   Acute on chronic respiratory failure with hypoxia (HCC)   Lobar pneumonia, unspecified organism (HCC)   Severe sepsis (HCC)   HIV disease (HCC)   Seizure disorder (HCC)   1. Acute respiratory failure hypoxia plan is to continue with BiPAP as ordered titrate oxygen continue pulmonary toilet. 2. Lobar pneumonia treated we will continue with supportive clinically is improving severe sepsis resolved 3. HIV is on treatment 4. At this time we  will continue with supportive care   I have personally seen and evaluated the patient, evaluated laboratory and imaging results, formulated the assessment and plan and placed orders. The Patient requires high complexity decision making for assessment and support.  Case was discussed on Rounds with the Respiratory Therapy Staff  Yevonne Pax, MD [facility] Pulmonary Critical Care Medicine Sleep Medicine

## 2019-06-28 DIAGNOSIS — B20 Human immunodeficiency virus [HIV] disease: Secondary | ICD-10-CM | POA: Diagnosis not present

## 2019-06-28 DIAGNOSIS — J9621 Acute and chronic respiratory failure with hypoxia: Secondary | ICD-10-CM | POA: Diagnosis not present

## 2019-06-28 DIAGNOSIS — G40909 Epilepsy, unspecified, not intractable, without status epilepticus: Secondary | ICD-10-CM | POA: Diagnosis not present

## 2019-06-28 DIAGNOSIS — J181 Lobar pneumonia, unspecified organism: Secondary | ICD-10-CM | POA: Diagnosis not present

## 2019-06-28 LAB — BASIC METABOLIC PANEL
Anion gap: 10 (ref 5–15)
BUN: 20 mg/dL (ref 6–20)
CO2: 23 mmol/L (ref 22–32)
Calcium: 8.5 mg/dL — ABNORMAL LOW (ref 8.9–10.3)
Chloride: 119 mmol/L — ABNORMAL HIGH (ref 98–111)
Creatinine, Ser: 0.44 mg/dL — ABNORMAL LOW (ref 0.61–1.24)
GFR calc Af Amer: >60 mL/min (ref >60)
GFR calc non Af Amer: >60 mL/min (ref >60)
Glucose, Bld: 123 mg/dL — ABNORMAL HIGH (ref 70–99)
Potassium: 5.7 mmol/L — ABNORMAL HIGH (ref 3.5–5.1)
Sodium: 152 mmol/L — ABNORMAL HIGH (ref 135–145)

## 2019-06-28 NOTE — Progress Notes (Signed)
Pulmonary Critical Care Medicine [facility] [place]   PULMONARY CRITICAL CARE SERVICE  PROGRESS NOTE  Date of Service: 06/28/2019  Anthony Rodriguez  TIW:[id number]  DOB: 1970-12-23   DOA: 06/19/2019  Referring Physician: Carron Curie, MD  HPI: Anthony Rodriguez is a 48 y.o. male seen for follow up of Acute on Chronic Respiratory Failure.  Patient is on 2 L oxygen and is using the BiPAP at nighttime when he is compliant  Medications: Reviewed on Rounds  Physical Exam:  Vitals: Temperature 96.3 pulse 83 respiratory rate 21 blood pressure 171/84 saturations 98%  Ventilator Settings off the ventilator on 2 L oxygen  . General: Comfortable at this time . Eyes: Grossly normal lids, irises & conjunctiva . ENT: grossly tongue is normal . Neck: no obvious mass . Cardiovascular: S1 S2 normal no gallop . Respiratory: No rhonchi no rales are noted at this time . Abdomen: soft . Skin: no rash seen on limited exam . Musculoskeletal: not rigid . Psychiatric:unable to assess . Neurologic: no seizure no involuntary movements         Lab Data:   Basic Metabolic Panel: Recent Labs  Lab 06/22/19 0514 06/24/19 0539 06/27/19 0536 06/28/19 0505  NA 146* 149* 152* 152*  K 3.7 3.7 4.4 5.7*  CL 104 110 120* 119*  CO2 34* 33* 23 23  GLUCOSE 103* 159* 131* 123*  BUN 17 20 19 20   CREATININE 0.51* 0.60* 0.57* 0.44*  CALCIUM 8.5* 8.6* 8.5* 8.5*    ABG: Recent Labs  Lab 06/07/2019 1858 06/26/19 1643  PHART 7.483* 7.443  PCO2ART 50.3* 47.0  PO2ART 79.7* 79.6*  HCO3 37.3* 31.6*  O2SAT 96.5 95.7    Liver Function Tests: Recent Labs  Lab 06/22/19 0514  AST 79*  ALT 52*  ALKPHOS 151*  BILITOT 8.0*  PROT 5.4*  ALBUMIN 1.9*   No results for input(s): LIPASE, AMYLASE in the last 168 hours. No results for input(s): AMMONIA in the last 168 hours.  CBC: Recent Labs  Lab 06/22/19 0514 06/24/19 0539 06/24/19 1415 06/26/19 1113 06/27/19 0536  WBC 9.5 8.5 7.3 7.4 7.3   HGB 12.6* 12.9* 12.2* 11.9* 13.1  HCT 39.4 39.7 37.6* 38.0* 40.9  MCV 124.3* 125.2* 124.9* 127.5* 127.8*  PLT 46* 51* 47* 43* 39*    Cardiac Enzymes: No results for input(s): CKTOTAL, CKMB, CKMBINDEX, TROPONINI in the last 168 hours.  BNP (last 3 results) No results for input(s): BNP in the last 8760 hours.  ProBNP (last 3 results) No results for input(s): PROBNP in the last 8760 hours.  Radiological Exams: DG Abd 1 View  Result Date: 06/26/2019 CLINICAL DATA:  Is gastric tube placement EXAM: ABDOMEN - 1 VIEW COMPARISON:  06/26/2019 chest radiograph and 06/24/2019 abdominal radiograph FINDINGS: Layering right pleural effusion. Nasogastric tube extends into the stomach. Although the tip of the tube is not included, based on the positioning of the side port, the tip of the tube is projected to be in the stomach body. A stent projects in the right upper quadrant. Coils are present just to the left of midline in the upper abdomen likely from prior embolization. Lower thoracic spondylosis. IMPRESSION: 1. The tip of the nasogastric tube is projected to be in the stomach body. 2. Right upper quadrant stent (probably from TIPS), left upper quadrant coils (likely from embolization). 3. Right pleural effusion. Electronically Signed   By: 06/26/2019 M.D.   On: 06/26/2019 19:56    Assessment/Plan Active Problems:   Acute  on chronic respiratory failure with hypoxia (HCC)   Lobar pneumonia, unspecified organism (HCC)   Severe sepsis (HCC)   HIV disease ([place])   Seizure disorder ([place])   1. Acute on chronic respiratory failure hypoxia plan is to continue with 2 L oxygen encourage compliance with BiPAP 2. Lobar pneumonia treated clinically is improved 3. Severe sepsis resolved we will continue to monitor 4. HIV disease on treatment 5. Seizure disorder no active seizures noted at this time.   I have personally seen and evaluated the patient, evaluated laboratory and imaging results,  formulated the assessment and plan and placed orders. The Patient requires high complexity decision making for assessment and support.  Case was discussed on Rounds with the Respiratory Therapy Staff  Allyne Gee, MD [facility] Pulmonary Critical Care Medicine Sleep Medicine

## 2019-06-29 LAB — BASIC METABOLIC PANEL
Anion gap: 9 (ref 5–15)
BUN: 17 mg/dL (ref 6–20)
CO2: 25 mmol/L (ref 22–32)
Calcium: 8.8 mg/dL — ABNORMAL LOW (ref 8.9–10.3)
Chloride: 118 mmol/L — ABNORMAL HIGH (ref 98–111)
Creatinine, Ser: 0.5 mg/dL — ABNORMAL LOW (ref 0.61–1.24)
GFR calc Af Amer: >60 mL/min (ref >60)
GFR calc non Af Amer: >60 mL/min (ref >60)
Glucose, Bld: 138 mg/dL — ABNORMAL HIGH (ref 70–99)
Potassium: 4 mmol/L (ref 3.5–5.1)
Sodium: 152 mmol/L — ABNORMAL HIGH (ref 135–145)

## 2019-06-30 LAB — BLOOD GAS, ARTERIAL
Acid-Base Excess: 1.1 mmol/L (ref 0.0–2.0)
Bicarbonate: 25.7 mmol/L (ref 20.0–28.0)
FIO2: 28
O2 Saturation: 96 %
Patient temperature: 37
pCO2 arterial: 44.5 mmHg (ref 32.0–48.0)
pH, Arterial: 7.379 (ref 7.350–7.450)
pO2, Arterial: 85.7 mmHg (ref 83.0–108.0)

## 2019-07-01 LAB — CULTURE, BODY FLUID W GRAM STAIN -BOTTLE: Culture: NO GROWTH

## 2019-07-02 LAB — COMPREHENSIVE METABOLIC PANEL
ALT: 66 U/L — ABNORMAL HIGH (ref 0–44)
AST: 62 U/L — ABNORMAL HIGH (ref 15–41)
Albumin: 2.1 g/dL — ABNORMAL LOW (ref 3.5–5.0)
Alkaline Phosphatase: 187 U/L — ABNORMAL HIGH (ref 38–126)
Anion gap: 7 (ref 5–15)
BUN: 12 mg/dL (ref 6–20)
CO2: 24 mmol/L (ref 22–32)
Calcium: 8.6 mg/dL — ABNORMAL LOW (ref 8.9–10.3)
Chloride: 111 mmol/L (ref 98–111)
Creatinine, Ser: 0.38 mg/dL — ABNORMAL LOW (ref 0.61–1.24)
GFR calc Af Amer: >60 mL/min (ref >60)
GFR calc non Af Amer: >60 mL/min (ref >60)
Glucose, Bld: 141 mg/dL — ABNORMAL HIGH (ref 70–99)
Potassium: 3.7 mmol/L (ref 3.5–5.1)
Sodium: 142 mmol/L (ref 135–145)
Total Bilirubin: 4.4 mg/dL — ABNORMAL HIGH (ref 0.3–1.2)
Total Protein: 5.3 g/dL — ABNORMAL LOW (ref 6.5–8.1)

## 2019-07-02 LAB — AMMONIA: Ammonia: 70 umol/L — ABNORMAL HIGH (ref 9–35)

## 2019-07-03 ENCOUNTER — Other Ambulatory Visit (HOSPITAL_COMMUNITY): Payer: Medicare Other

## 2019-07-03 MED ORDER — GENERIC EXTERNAL MEDICATION
Status: DC
Start: ? — End: 2019-07-03

## 2019-07-05 ENCOUNTER — Encounter: Payer: Self-pay | Admitting: Internal Medicine

## 2019-07-05 ENCOUNTER — Other Ambulatory Visit (HOSPITAL_COMMUNITY): Payer: Medicare Other

## 2019-07-05 LAB — BASIC METABOLIC PANEL
Anion gap: 6 (ref 5–15)
BUN: 15 mg/dL (ref 6–20)
CO2: 27 mmol/L (ref 22–32)
Calcium: 8.4 mg/dL — ABNORMAL LOW (ref 8.9–10.3)
Chloride: 111 mmol/L (ref 98–111)
Creatinine, Ser: 0.64 mg/dL (ref 0.61–1.24)
GFR calc Af Amer: >60 mL/min (ref >60)
GFR calc non Af Amer: >60 mL/min (ref >60)
Glucose, Bld: 151 mg/dL — ABNORMAL HIGH (ref 70–99)
Potassium: 3.4 mmol/L — ABNORMAL LOW (ref 3.5–5.1)
Sodium: 144 mmol/L (ref 135–145)

## 2019-07-05 LAB — CBC
HCT: 35.3 % — ABNORMAL LOW (ref 39.0–52.0)
Hemoglobin: 11.9 g/dL — ABNORMAL LOW (ref 13.0–17.0)
MCH: 40.9 pg — ABNORMAL HIGH (ref 26.0–34.0)
MCHC: 33.7 g/dL (ref 30.0–36.0)
MCV: 121.3 fL — ABNORMAL HIGH (ref 80.0–100.0)
Platelets: 20 10*3/uL — CL (ref 150–400)
RBC: 2.91 MIL/uL — ABNORMAL LOW (ref 4.22–5.81)
RDW: 17.1 % — ABNORMAL HIGH (ref 11.5–15.5)
WBC: 5.3 10*3/uL (ref 4.0–10.5)
nRBC: 0 % (ref 0.0–0.2)

## 2019-07-05 LAB — AMMONIA: Ammonia: 69 umol/L — ABNORMAL HIGH (ref 9–35)

## 2019-07-05 MED ORDER — GENERIC EXTERNAL MEDICATION
Status: DC
Start: ? — End: 2019-07-05

## 2019-07-05 NOTE — Consult Note (Signed)
Chief Complaint: Patient was seen in consultation today for gastrostomy placement.  Referring Physician(s): Dr. Luna KitchensStephanie Brown  Supervising Physician: Simonne ComeWatts, John  [facility]  History of Present Illness: Anthony Rodriguez is a 48 y.o. male with a past medical history of significant for bipolar disorder, seizure disorder, MS, tobacco use, HTN, thrombocytopenia, HIV, hepatitis C and cirrhosis who [facility] today in consultation for possible gastrostomy placement. Per Care Everywhere [facility], [place] from 12/3 - 12/17 for pneumonia. During his stay he developed increased oxygen demands and worth of breathing which ultimately required intubation on 12/7. He was unable to wean from the ventilator at time of discharge but [facility] He is unable to tolerate PO intake and is currently receiving TPN via NG. IR has been asked to place a gastrostomy tube for long term nutrition.  Patient sleeping upon entry to room, arouses briefly but quickly returns to sleep. No family at bedside today.   Past Medical History:  Diagnosis Date  . Acute on chronic respiratory failure with hypoxia (HCC)   . HIV disease (HCC)   . Lobar pneumonia, unspecified organism (HCC)   . Seizure disorder (HCC)   . Severe sepsis [facility](HCC)     Past Surgical History:  Procedure Laterality Date  . IR THORACENTESIS ASP PLEURAL SPACE W/IMG GUIDE  06/26/2019    Allergies: Patient has no allergy information on record.  Medications: Prior to Admission medications   Not on File     No family history on file.  Social History   Socioeconomic History  . Marital status: Divorced    Spouse name: Not on file  . Number of children: Not on file  . Years of education: Not on file  . Highest education level: Not on file  Occupational History  . Not on file  Tobacco Use  . Smoking status: Not on file    Substance and Sexual Activity  . Alcohol use: Not on file  . Drug use: Not on file  . Sexual activity: Not on file  Other Topics Concern  . Not on file  Social History Narrative  . Not on file   Social Determinants of Health   Financial Resource Strain:   . Difficulty of Paying Living Expenses: Not on file  Food Insecurity:   . Worried About Programme researcher, broadcasting/film/videounning Out of Food in the Last Year: Not on file  . Ran Out of Food in the Last Year: Not on file  Transportation Needs:   . Lack of Transportation (Medical): Not on file  . Lack of Transportation (Non-Medical): Not on file  Physical Activity:   . Days of Exercise per Week: Not on file  . Minutes of Exercise per Session: Not on file  Stress:   . Feeling of Stress : Not on file  Social Connections:   . Frequency of Communication with Friends and Family: Not on file  . Frequency of Social Gatherings with Friends and Family: Not on file  . Attends Religious Services: Not on file  . Active Member of Clubs or Organizations: Not on file  . Attends BankerClub or Organization Meetings: Not on file  . Marital Status: Not on file     Review of Systems: A 12 point ROS discussed and pertinent positives are indicated in the HPI above.  All other systems are negative.  Review of Systems  Unable to perform ROS: Patient unresponsive  Vital Signs: There were no vitals taken for this visit.  Physical Exam Vitals reviewed.  Constitutional:      General: He is not in acute distress.    Appearance: He is ill-appearing.     Comments: Arouses briefly to loud verbal cues, returns to sleep quickly. Does not follow commands on my exam today.   HENT:     Head: Normocephalic.  Cardiovascular:     Rate and Rhythm: Regular rhythm. Tachycardia present.  Pulmonary:     Effort: Pulmonary effort is normal.     Comments: Anterior exam only - decreased breath sounds bilaterally. On 1L O2 via . Abdominal:     General: There is no distension.     Palpations:  Abdomen is soft.     Comments: (+) NG, tube feeds infusing  Skin:    General: Skin is warm and dry.      MD Evaluation Airway: (Unable to assess - patient does not follow commands; somnolent) Heart: WNL Abdomen: WNL Chest/ Lungs: WNL ASA  Classification: 2 Mallampati/Airway Score: (Unable to assess - patient does not follow commands; somnolent.)   Imaging: CT ABDOMEN WO CONTRAST  Result Date: 07/05/2019 CLINICAL DATA:  Upper arm sepsis, lobar pneumonia, HIV positive, chronic respiratory failure with hypoxia, needs enteral feeding support. Preop planning for gastrostomy tube EXAM: CT ABDOMEN WITHOUT CONTRAST TECHNIQUE: Multidetector CT imaging of the abdomen was performed following the standard protocol without IV contrast. COMPARISON:  None. FINDINGS: Lower chest: Large right and trace left pleural effusions. Significant consolidation/atelectasis in the visualized right lower lung. Hepatobiliary: TIPS stent. Coarse calcification in the anterior right hepatic lobe of indeterminate significance. Small liver with nodular contour suggesting cirrhosis. Gallbladder nondistended. No biliary ductal dilatation. Pancreas: Unremarkable. No pancreatic ductal dilatation or surrounding inflammatory changes. Spleen: Splenomegaly without focal lesion. Adrenals/Urinary Tract: Adrenals unremarkable 1.8 cm calculus in the mid right renal collecting system. No hydronephrosis. Stomach/Bowel: Nasogastric tube extends into the decompressed stomach. There is a percutaneous approach for gastrostomy placement. Visualized small bowel and colon are nondilated, unremarkable. Vascular/Lymphatic: Embolization coils in the left upper abdomen medial to the stomach, resulting in significant regional streak artifact. Minimal aortoiliac calcified plaque. No aortic aneurysm. Peripheral calcifications in dilated SMV and main portal vein suggesting chronic calcified thrombus. Other: Small volume perihepatic and perisplenic ascites.  No free air. Musculoskeletal: No acute or significant osseous findings. IMPRESSION: 1. There is a safe percutaneous window for gastrostomy placement. 2. Small volume abdominal ascites, a relative contraindication to percutaneous gastrostomy. 3. Nodular shrunken liver, splenomegaly, and TIPS stent consistent with cirrhosis and portal venous hypertension. 4. Right nephrolithiasis without hydronephrosis. Electronically Signed   By: Corlis Leak M.D.   On: 07/05/2019 07:40   DG Abd 1 View  Result Date: 07/03/2019 CLINICAL DATA:  Obstruction versus ileus EXAM: ABDOMEN - 1 VIEW COMPARISON:  Seven days ago FINDINGS: Enteric tube tip at the mid stomach. Tips and presumed varix coiling. Bowel gas pattern is normal. Calcification over the right flank, likely urinary. IMPRESSION: 1. Normal bowel gas pattern. 2. Normal enteric tube positioning. 3. TIPS and presumed varix coiling. Electronically Signed   By: Marnee Spring M.D.   On: 07/03/2019 11:49   DG Abd 1 View  Result Date: 06/26/2019 CLINICAL DATA:  Is gastric tube placement EXAM: ABDOMEN - 1 VIEW COMPARISON:  06/26/2019 chest radiograph and 06/24/2019 abdominal radiograph FINDINGS: Layering right pleural effusion. Nasogastric tube extends into the stomach. Although the tip of the tube is not included, based on the positioning of  the side port, the tip of the tube is projected to be in the stomach body. A stent projects in the right upper quadrant. Coils are present just to the left of midline in the upper abdomen likely from prior embolization. Lower thoracic spondylosis. IMPRESSION: 1. The tip of the nasogastric tube is projected to be in the stomach body. 2. Right upper quadrant stent (probably from TIPS), left upper quadrant coils (likely from embolization). 3. Right pleural effusion. Electronically Signed   By: Gaylyn Rong M.D.   On: 06/26/2019 19:56   DG Abd 1 View  Result Date: 06/24/2019 CLINICAL DATA:  Orogastric tube placement. EXAM: ABDOMEN  - 1 VIEW COMPARISON:  One view abdomen 06/26/2019. FINDINGS: 1430 hours. Tip of the enteric tube projects over the L2 vertebral body, likely in the distal stomach. The visualized bowel gas pattern is normal. Embolization coils overlie the epigastric region. There is a metallic stent in the right upper quadrant most consistent with previous TIPS procedure. There is a right abdominal calcification which may reflect a renal calculus or gallstone. Additional surgical clips overlie the right iliac bone. IMPRESSION: 1. Enteric tube tip projects over the distal stomach. 2. Right abdominal calcification may reflect a renal calculus or gallstone. 3. Postsurgical changes as described. Electronically Signed   By: Carey Bullocks M.D.   On: 06/24/2019 15:25   DG Abd 1 View  Result Date: 06/24/2019 CLINICAL DATA:  OG and ETT placement EXAM: ABDOMEN - 1 VIEW COMPARISON:  Concurrent chest radiograph FINDINGS: Central venous catheter tip terminates in the region of the right atrium. Transesophageal tube tip and side port distal to the GE junction. Vascular coil pack noted in the midline upper abdomen. A biliary stent present in the right upper quadrant. Additional surgical clip seen of the right lower quadrant. Telemetry leads overlies the left hemiabdomen. Nonobstructive bowel gas pattern. Few punctate radiodensities project over the right renal shadow. Streaky basilar opacities in the lungs likely reflect some atelectasis and right effusion. IMPRESSION: 1. Central venous catheter tip terminates in the right atrium. 2. Transesophageal tube tip and side port distal to the GE junction. 3. Few punctate radiodensities project over the right renal shadow. Could reflect nephrolithiasis. 4. Nonobstructive bowel gas pattern. Electronically Signed   By: Kreg Shropshire M.D.   On: 06/24/2019 17:34   DG Chest Port 1 View  Result Date: 06/26/2019 CLINICAL DATA:  Right-sided thoracentesis. EXAM: PORTABLE CHEST 1 VIEW COMPARISON:   06/24/2019 FINDINGS: The endotracheal tube is 4.5 cm above the carina. The NG tube is coursing down the esophagus and into the stomach. Interval evacuation of the right pleural fluid collection. No postprocedural pneumothorax is identified. Persistent asymmetric pulmonary edema. The left lung appears better aerated. IMPRESSION: Evacuation of right pleural fluid collection without postprocedural pneumothorax. Slight improved lung aeration with decreased pulmonary edema. Electronically Signed   By: Rudie Meyer M.D.   On: 06/26/2019 11:26   DG CHEST PORT 1 VIEW  Result Date: 06/24/2019 CLINICAL DATA:  Respiratory failure. EXAM: PORTABLE CHEST 1 VIEW COMPARISON:  Chest x-ray dated June 21, 2019. FINDINGS: Unchanged endotracheal and enteric tubes. Interval removal of the right upper extremity PICC line. The heart size and mediastinal contours are within normal limits. Increased hazy density throughout the right lung with widening of the lateral pleural margin, suggestive of layering pleural effusion. Superimposed patchy density at the right lung base. No pneumothorax. No acute osseous abnormality. IMPRESSION: 1. Moderate layering right pleural effusion, new since the prior study. Patchy density at  the right lung base could reflect atelectasis or infiltrate. Electronically Signed   By: Titus Dubin M.D.   On: 06/24/2019 08:21   DG Chest Port 1 View  Result Date: 02-Jul-2019 CLINICAL DATA:  Line placement EXAM: PORTABLE CHEST 1 VIEW COMPARISON:  None. FINDINGS: Endotracheal tube is approximately 6 cm from the carina. Enteric tube passes into the stomach with tip out of field of view. Right PICC line tip overlies the cavoatrial junction. No focal consolidation. Question hazy right lung increased density. No pneumothorax. No significant pleural effusion. Normal heart size. IMPRESSION: Lines and tubes as above. Possible hazy increased density on the right, which may reflect atelectasis or asymmetric edema.  Electronically Signed   By: Macy Mis M.D.   On: 07-02-2019 17:30   IR THORACENTESIS ASP PLEURAL SPACE W/IMG GUIDE  Result Date: 06/26/2019 INDICATION: Patient with history of pneumonia, right pleural effusion. Request is made for diagnostic and therapeutic right thoracentesis. EXAM: ULTRASOUND GUIDED DIAGNOSTIC AND THERAPEUTIC RIGHT THORACENTESIS MEDICATIONS: 10 ML 1% LIDOCAINE COMPLICATIONS: None immediate. PROCEDURE: An ultrasound guided thoracentesis was thoroughly discussed with the patient and questions answered. The benefits, risks, alternatives and complications were also discussed. The patient understands and wishes to proceed with the procedure. Written consent was obtained. Ultrasound was performed to localize and mark an adequate pocket of fluid in the right chest. The area was then prepped and draped in the normal sterile fashion. 1% Lidocaine was used for local anesthesia. Under ultrasound guidance a 6 Fr Safe-T-Centesis catheter was introduced. Thoracentesis was performed. The catheter was removed and a dressing applied. FINDINGS: A total of approximately 650 mL of yellow fluid was removed. Samples were sent to the laboratory as requested by the clinical team. IMPRESSION: Successful ultrasound guided diagnostic and therapeutic right thoracentesis yielding 650 mL of pleural fluid. Read by: Brynda Greathouse PA-C Electronically Signed   By: Aletta Edouard M.D.   On: 06/26/2019 11:46    Labs:  CBC: Recent Labs    06/24/19 1415 06/26/19 1113 06/27/19 0536 07/05/19 0511  WBC 7.3 7.4 7.3 5.3  HGB 12.2* 11.9* 13.1 11.9*  HCT 37.6* 38.0* 40.9 35.3*  PLT 47* 43* 39* 20*    COAGS: No results for input(s): INR, APTT in the last 8760 hours.  BMP: Recent Labs    06/28/19 0505 06/29/19 0407 07/02/19 1126 07/05/19 0511  NA 152* 152* 142 144  K 5.7* 4.0 3.7 3.4*  CL 119* 118* 111 111  CO2 23 25 24 27   GLUCOSE 123* 138* 141* 151*  BUN 20 17 12 15   CALCIUM 8.5* 8.8* 8.6* 8.4*    CREATININE 0.44* 0.50* 0.38* 0.64  GFRNONAA >60 >60 >60 >60  GFRAA >60 >60 >60 >60    LIVER FUNCTION TESTS: Recent Labs    06/22/19 0514 07/02/19 1126  BILITOT 8.0* 4.4*  AST 79* 62*  ALT 52* 66*  ALKPHOS 151* 187*  PROT 5.4* 5.3*  ALBUMIN 1.9* 2.1*    TUMOR MARKERS: No results for input(s): AFPTM, CEA, CA199, CHROMGRNA in the last 8760 hours.  Assessment and Plan:  48 y/o M with history as per HPI admitted to [facility] for further management of acute respiratory failure requiring prolonged intubation/mechanical ventilation, unable to tolerate PO intake and currently receiving TPN via NG - IR has been consulted for gastrostomy placement.   Will tentatively plan for gastrostomy placement Tuesday 07/09/18 pending any emergent procedures. Orders placed in [facility] chart for patient to be NPO/tube feeds starting midnight 07/09/18, he is not currently on  any blood thinning medications per med list - orders placed to not start any lovenox/heparin until post procedure, Covid (-) 12/20, AM labs ordered. He is currently afebrile however plts today noted to be 20 with hgb 11.9, WBC 5.3.   Consent has not yet been signed at the time of this note writing. PA will call patient's daughter Jozsef Wescoat [phone] for consent prior to procedure.  Please call with questions or concerns.  Thank you for this interesting consult.  I greatly enjoyed meeting LATHON ADAN and look forward to participating in their care.  A copy of this report was sent to the requesting provider on this date.  Electronically Signed: Villa Herb, PA-C 07/05/2019, 2:40 PM   I spent a total of 40 Minutes in face to face in clinical consultation, greater than 50% of which was counseling/coordinating care for gastrostomy placement.

## 2019-07-06 ENCOUNTER — Other Ambulatory Visit (HOSPITAL_COMMUNITY): Payer: Medicare Other

## 2019-07-06 LAB — POTASSIUM: Potassium: 3.4 mmol/L — ABNORMAL LOW (ref 3.5–5.1)

## 2019-07-06 DEATH — deceased

## 2019-07-07 LAB — CBC
HCT: 39.3 % (ref 39.0–52.0)
Hemoglobin: 13.3 g/dL (ref 13.0–17.0)
MCH: 40.9 pg — ABNORMAL HIGH (ref 26.0–34.0)
MCHC: 33.8 g/dL (ref 30.0–36.0)
MCV: 120.9 fL — ABNORMAL HIGH (ref 80.0–100.0)
Platelets: 33 10*3/uL — ABNORMAL LOW (ref 150–400)
RBC: 3.25 MIL/uL — ABNORMAL LOW (ref 4.22–5.81)
RDW: 16.5 % — ABNORMAL HIGH (ref 11.5–15.5)
WBC: 5.3 10*3/uL (ref 4.0–10.5)
nRBC: 0 % (ref 0.0–0.2)

## 2019-07-07 LAB — BASIC METABOLIC PANEL
Anion gap: 9 (ref 5–15)
BUN: 16 mg/dL (ref 6–20)
CO2: 26 mmol/L (ref 22–32)
Calcium: 8.6 mg/dL — ABNORMAL LOW (ref 8.9–10.3)
Chloride: 112 mmol/L — ABNORMAL HIGH (ref 98–111)
Creatinine, Ser: 0.44 mg/dL — ABNORMAL LOW (ref 0.61–1.24)
GFR calc Af Amer: >60 mL/min (ref >60)
GFR calc non Af Amer: >60 mL/min (ref >60)
Glucose, Bld: 135 mg/dL — ABNORMAL HIGH (ref 70–99)
Potassium: 4.4 mmol/L (ref 3.5–5.1)
Sodium: 147 mmol/L — ABNORMAL HIGH (ref 135–145)

## 2019-07-07 LAB — AMMONIA: Ammonia: 62 umol/L — ABNORMAL HIGH (ref 9–35)

## 2019-07-08 ENCOUNTER — Other Ambulatory Visit (HOSPITAL_COMMUNITY): Payer: Medicare Other

## 2019-07-08 LAB — URINALYSIS, ROUTINE W REFLEX MICROSCOPIC
Bacteria, UA: NONE SEEN
Bilirubin Urine: NEGATIVE
Glucose, UA: NEGATIVE mg/dL
Ketones, ur: NEGATIVE mg/dL
Nitrite: NEGATIVE
Protein, ur: 100 mg/dL — AB
RBC / HPF: >50 RBC/hpf — ABNORMAL HIGH (ref 0–5)
Specific Gravity, Urine: 1.028 (ref 1.005–1.030)
WBC, UA: >50 WBC/hpf — ABNORMAL HIGH (ref 0–5)
pH: 6 (ref 5.0–8.0)

## 2019-07-08 MED ORDER — GENERIC EXTERNAL MEDICATION
Status: DC
Start: ? — End: 2019-07-08

## 2019-07-09 LAB — BASIC METABOLIC PANEL
Anion gap: 7 (ref 5–15)
BUN: 23 mg/dL — ABNORMAL HIGH (ref 6–20)
CO2: 30 mmol/L (ref 22–32)
Calcium: 8.8 mg/dL — ABNORMAL LOW (ref 8.9–10.3)
Chloride: 123 mmol/L — ABNORMAL HIGH (ref 98–111)
Creatinine, Ser: 0.48 mg/dL — ABNORMAL LOW (ref 0.61–1.24)
GFR calc Af Amer: >60 mL/min (ref >60)
GFR calc non Af Amer: >60 mL/min (ref >60)
Glucose, Bld: 171 mg/dL — ABNORMAL HIGH (ref 70–99)
Potassium: 3.6 mmol/L (ref 3.5–5.1)
Sodium: 160 mmol/L — ABNORMAL HIGH (ref 135–145)

## 2019-07-09 LAB — CBC
HCT: 42.3 % (ref 39.0–52.0)
Hemoglobin: 13.6 g/dL (ref 13.0–17.0)
MCH: 40.4 pg — ABNORMAL HIGH (ref 26.0–34.0)
MCHC: 32.2 g/dL (ref 30.0–36.0)
MCV: 125.5 fL — ABNORMAL HIGH (ref 80.0–100.0)
Platelets: 24 10*3/uL — CL (ref 150–400)
RBC: 3.37 MIL/uL — ABNORMAL LOW (ref 4.22–5.81)
RDW: 16.3 % — ABNORMAL HIGH (ref 11.5–15.5)
WBC: 5.1 10*3/uL (ref 4.0–10.5)
nRBC: 0 % (ref 0.0–0.2)

## 2019-07-09 LAB — URINE CULTURE: Culture: NO GROWTH

## 2019-07-10 ENCOUNTER — Other Ambulatory Visit (HOSPITAL_COMMUNITY): Payer: Medicare Other

## 2019-07-10 HISTORY — PX: IR PARACENTESIS: IMG2679

## 2019-07-10 LAB — BASIC METABOLIC PANEL
Anion gap: 8 (ref 5–15)
BUN: 23 mg/dL — ABNORMAL HIGH (ref 6–20)
CO2: 30 mmol/L (ref 22–32)
Calcium: 8.9 mg/dL (ref 8.9–10.3)
Chloride: 121 mmol/L — ABNORMAL HIGH (ref 98–111)
Creatinine, Ser: 0.48 mg/dL — ABNORMAL LOW (ref 0.61–1.24)
GFR calc Af Amer: >60 mL/min (ref >60)
GFR calc non Af Amer: >60 mL/min (ref >60)
Glucose, Bld: 160 mg/dL — ABNORMAL HIGH (ref 70–99)
Potassium: 3.4 mmol/L — ABNORMAL LOW (ref 3.5–5.1)
Sodium: 159 mmol/L — ABNORMAL HIGH (ref 135–145)

## 2019-07-10 MED ORDER — LIDOCAINE HCL 1 % IJ SOLN
INTRAMUSCULAR | Status: AC
  Filled 2019-07-10: qty 20

## 2019-07-10 NOTE — Procedures (Signed)
PROCEDURE SUMMARY:  Successful image-guided paracentesis from the right lower abdomen.  Yielded 2.3 liters of clear yellow fluid.  No immediate complications.  EBL: zero Patient tolerated well.   Specimen was not sent for labs.  Please see imaging section of Epic for full dictation.  Villa Herb PA-C 07/10/2019 2:17 PM

## 2019-07-13 LAB — POTASSIUM: Potassium: 3.9 mmol/L (ref 3.5–5.1)

## 2019-07-14 LAB — CBC
HCT: 45.5 % (ref 39.0–52.0)
Hemoglobin: 13.7 g/dL (ref 13.0–17.0)
MCH: 40.7 pg — ABNORMAL HIGH (ref 26.0–34.0)
MCHC: 30.1 g/dL (ref 30.0–36.0)
MCV: 135 fL — ABNORMAL HIGH (ref 80.0–100.0)
Platelets: 24 10*3/uL — CL (ref 150–400)
RBC: 3.37 MIL/uL — ABNORMAL LOW (ref 4.22–5.81)
RDW: 16.1 % — ABNORMAL HIGH (ref 11.5–15.5)
WBC: 6.2 10*3/uL (ref 4.0–10.5)
nRBC: 0.8 % — ABNORMAL HIGH (ref 0.0–0.2)

## 2019-07-14 LAB — BLOOD GAS, ARTERIAL
Acid-Base Excess: 1.9 mmol/L (ref 0.0–2.0)
Bicarbonate: 34.3 mmol/L — ABNORMAL HIGH (ref 20.0–28.0)
FIO2: 50
O2 Saturation: 94.8 %
Patient temperature: 37
pCO2 arterial: >120 mmHg (ref 32.0–48.0)
pH, Arterial: 6.929 — CL (ref 7.350–7.450)
pO2, Arterial: 120 mmHg — ABNORMAL HIGH (ref 83.0–108.0)

## 2019-07-14 LAB — BASIC METABOLIC PANEL
BUN: 37 mg/dL — ABNORMAL HIGH (ref 6–20)
CO2: 29 mmol/L (ref 22–32)
Calcium: 9.3 mg/dL (ref 8.9–10.3)
Chloride: >130 mmol/L (ref 98–111)
Creatinine, Ser: 0.72 mg/dL (ref 0.61–1.24)
GFR calc Af Amer: >60 mL/min (ref >60)
GFR calc non Af Amer: >60 mL/min (ref >60)
Glucose, Bld: 167 mg/dL — ABNORMAL HIGH (ref 70–99)
Potassium: 3.8 mmol/L (ref 3.5–5.1)
Sodium: 175 mmol/L (ref 135–145)

## 2019-08-06 DEATH — deceased

## 2021-10-14 IMAGING — CT CT ABDOMEN W/O CM
2 of 4 series · 14 of 46 positions shown, 16 images · non-contrast
Comparison: None.

CLINICAL DATA: Upper arm sepsis, lobar pneumonia, HIV positive,
chronic respiratory failure with hypoxia, needs enteral feeding
support. Preop planning for gastrostomy tube

EXAM:
CT ABDOMEN WITHOUT CONTRAST
TECHNIQUE: Multidetector CT imaging of the abdomen was performed following the
standard protocol without IV contrast.

[Series 3: abdomen wo 5.0 i30f 2 · axial · 0.96mm/px · z∈[+1014,+1244]mm · 11 of 56 slices shown, 13 images]
[im 5/56  soft-tissue]
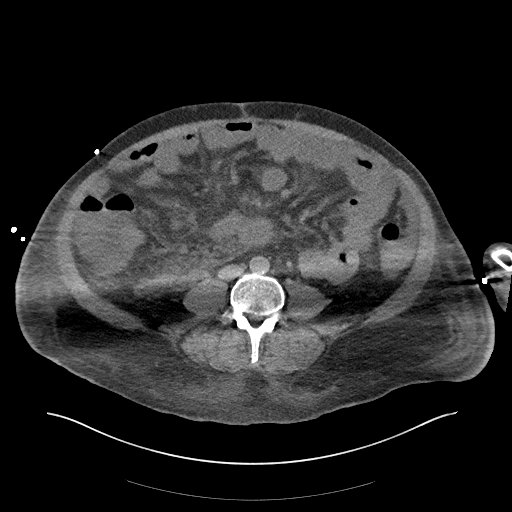
[im 5/56  bone]
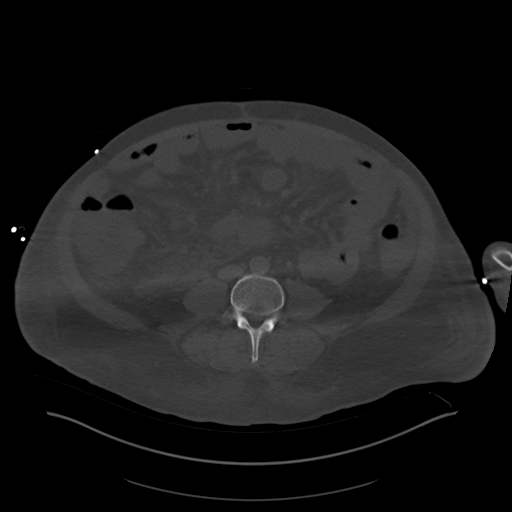
[im 9/56  soft-tissue]
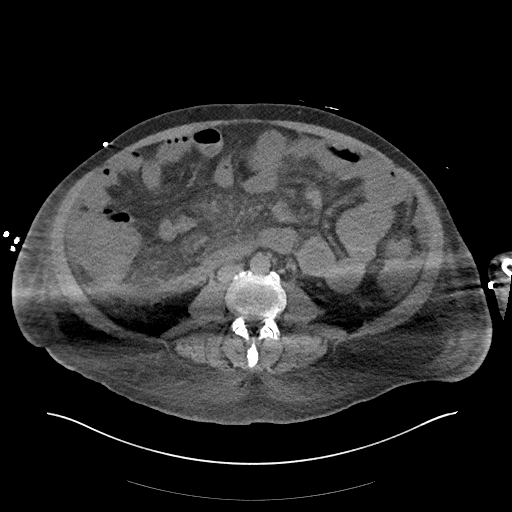
[im 14/56  soft-tissue]
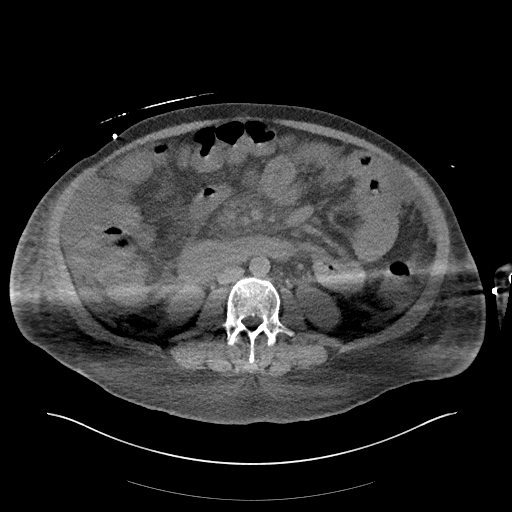
[im 18/56  soft-tissue]
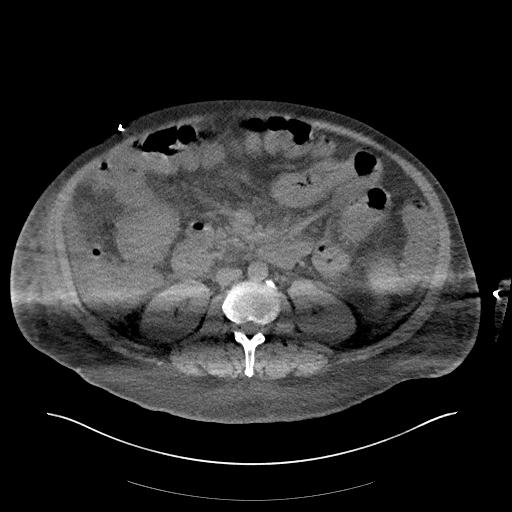
[im 23/56  soft-tissue]
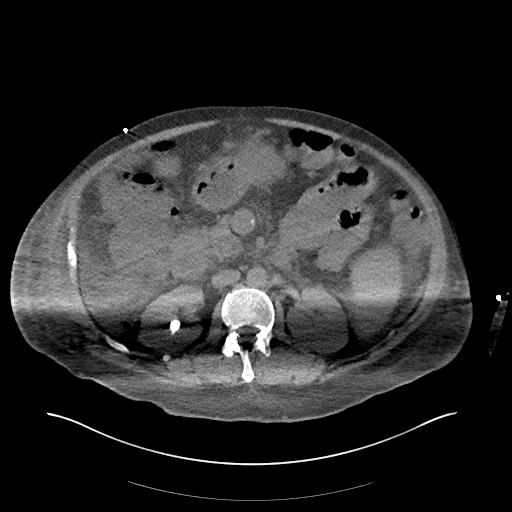
[im 29/56  soft-tissue]
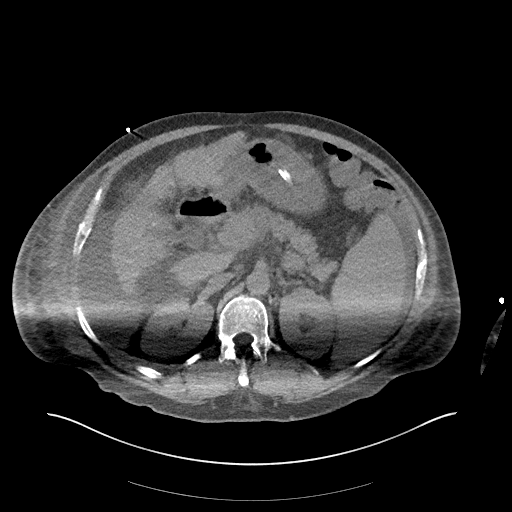
[im 34/56  soft-tissue]
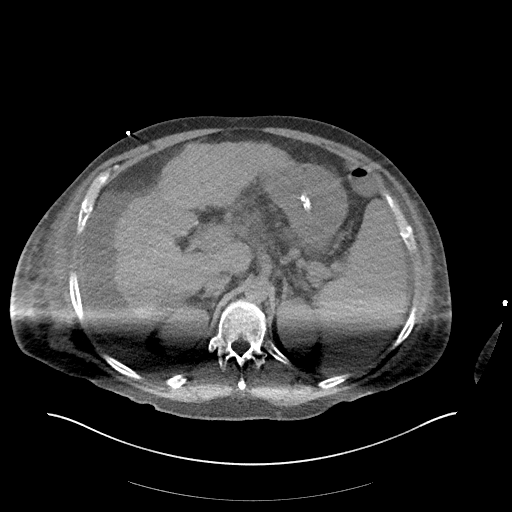
[im 38/56  soft-tissue]
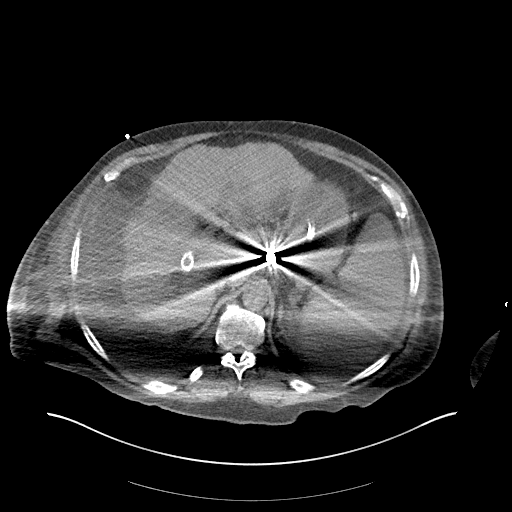
[im 42/56  soft-tissue]
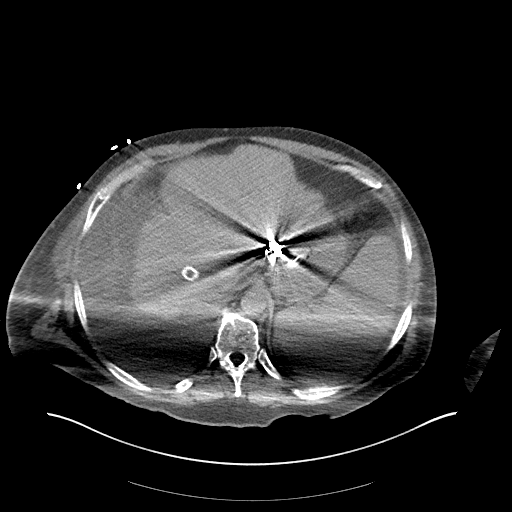
[im 42/56  bone]
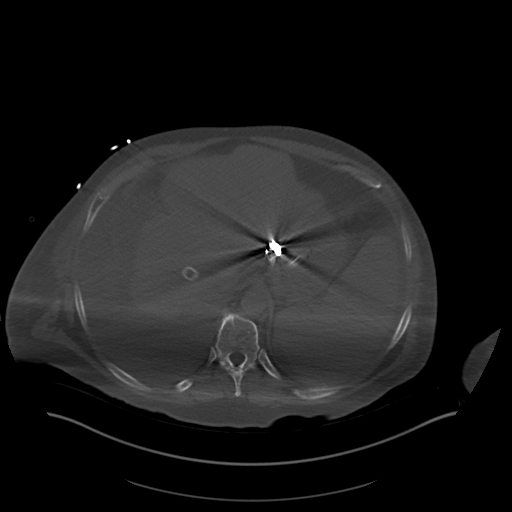
[im 47/56  soft-tissue]
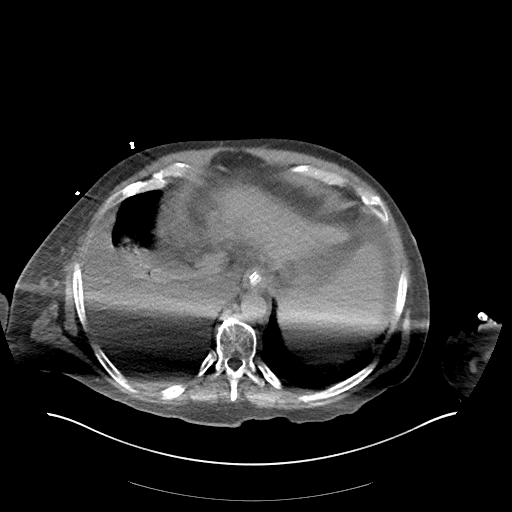
[im 51/56  soft-tissue]
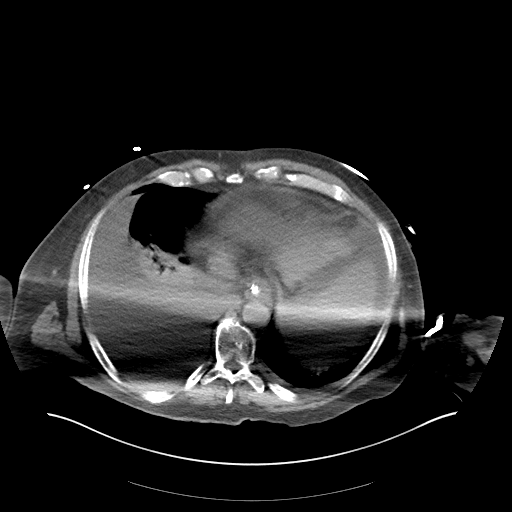

[Series 6: cor st · coronal · 0.54mm/px · 3 of 103 slices shown]
[im 35/103  soft-tissue]
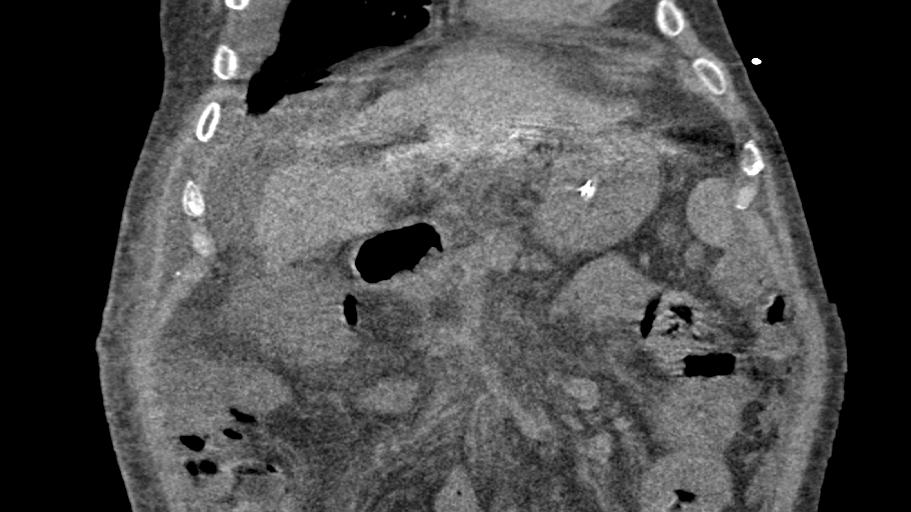
[im 46/103  soft-tissue]
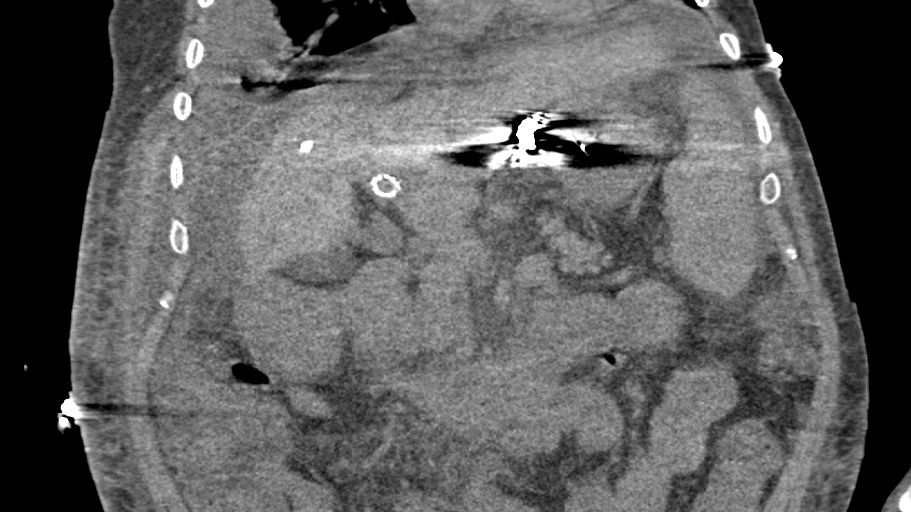
[im 57/103  soft-tissue]
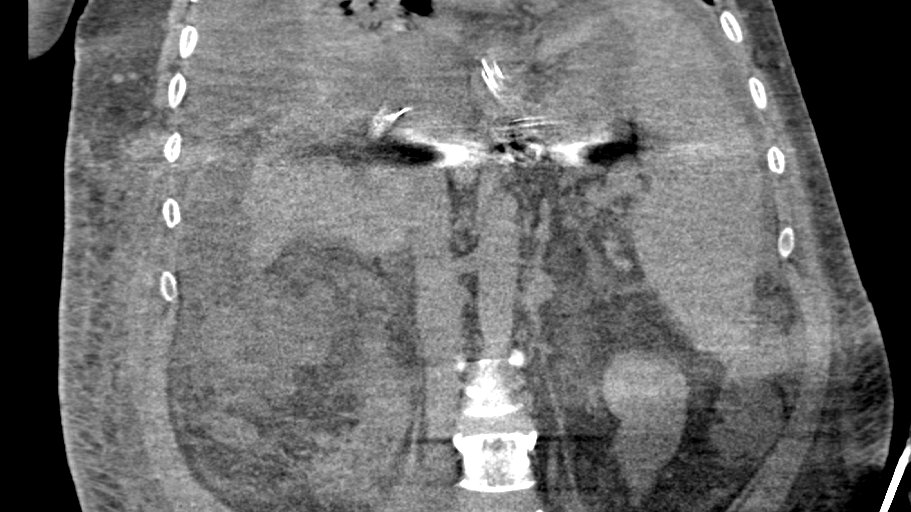

[14 of 46 positions shown; findings below may reference images not displayed]

FINDINGS: Lower chest: Large right and trace left pleural effusions.
Significant consolidation/atelectasis in the visualized right lower
lung.

Hepatobiliary: TIPS stent. Coarse calcification in the anterior
right hepatic lobe of indeterminate significance. Small liver with
nodular contour suggesting cirrhosis. Gallbladder nondistended. No
biliary ductal dilatation.

Pancreas: Unremarkable. No pancreatic ductal dilatation or
surrounding inflammatory changes.

Spleen: Splenomegaly without focal lesion.

Adrenals/Urinary Tract: Adrenals unremarkable

1.8 cm calculus in the mid right renal collecting system. No
hydronephrosis.

Stomach/Bowel: Nasogastric tube extends into the decompressed
stomach. There is a percutaneous approach for gastrostomy placement.
Visualized small bowel and colon are nondilated, unremarkable.

Vascular/Lymphatic: Embolization coils in the left upper abdomen
medial to the stomach, resulting in significant regional streak
artifact. Minimal aortoiliac calcified plaque. No aortic aneurysm.
Peripheral calcifications in dilated SMV and main portal vein
suggesting chronic calcified thrombus.

Other: Small volume perihepatic and perisplenic ascites. No free
air.

Musculoskeletal: No acute or significant osseous findings.
IMPRESSION: 1. There is a safe percutaneous window for gastrostomy placement.
2. Small volume abdominal ascites, a relative contraindication to
percutaneous gastrostomy.
3. Nodular shrunken liver, splenomegaly, and TIPS stent consistent
with cirrhosis and portal venous hypertension.
4. Right nephrolithiasis without hydronephrosis.
